# Patient Record
Sex: Female | Born: 1935 | Race: White | Hispanic: No | Marital: Married
Health system: Southern US, Community
[De-identification: ages and names within clinical notes are randomized; demographics above are authoritative.]

---

## 1997-07-26 ENCOUNTER — Other Ambulatory Visit: Admission: RE | Admit: 1997-07-26 | Discharge: 1997-07-26 | Payer: Self-pay | Admitting: Obstetrics and Gynecology

## 1999-01-08 ENCOUNTER — Other Ambulatory Visit: Admission: RE | Admit: 1999-01-08 | Discharge: 1999-01-08 | Payer: Self-pay | Admitting: Obstetrics and Gynecology

## 2000-03-04 ENCOUNTER — Other Ambulatory Visit: Admission: RE | Admit: 2000-03-04 | Discharge: 2000-03-04 | Payer: Self-pay | Admitting: Obstetrics and Gynecology

## 2000-09-18 ENCOUNTER — Encounter (INDEPENDENT_AMBULATORY_CARE_PROVIDER_SITE_OTHER): Payer: Self-pay | Admitting: *Deleted

## 2000-09-18 ENCOUNTER — Encounter: Payer: Self-pay | Admitting: Emergency Medicine

## 2000-09-19 ENCOUNTER — Encounter: Payer: Self-pay | Admitting: Cardiology

## 2000-09-19 ENCOUNTER — Inpatient Hospital Stay (HOSPITAL_COMMUNITY): Admission: EM | Admit: 2000-09-19 | Discharge: 2000-09-30 | Payer: Self-pay | Admitting: Emergency Medicine

## 2000-09-20 ENCOUNTER — Encounter: Payer: Self-pay | Admitting: Cardiology

## 2000-09-23 ENCOUNTER — Encounter: Payer: Self-pay | Admitting: Cardiology

## 2000-09-24 ENCOUNTER — Encounter: Payer: Self-pay | Admitting: Cardiology

## 2000-09-29 ENCOUNTER — Encounter: Payer: Self-pay | Admitting: Cardiology

## 2000-09-30 ENCOUNTER — Encounter: Payer: Self-pay | Admitting: Cardiology

## 2000-10-24 ENCOUNTER — Encounter: Payer: Self-pay | Admitting: Cardiothoracic Surgery

## 2000-10-24 ENCOUNTER — Encounter: Admission: RE | Admit: 2000-10-24 | Discharge: 2000-10-24 | Payer: Self-pay | Admitting: Cardiothoracic Surgery

## 2001-01-29 ENCOUNTER — Encounter: Payer: Self-pay | Admitting: Cardiothoracic Surgery

## 2001-01-29 ENCOUNTER — Encounter: Admission: RE | Admit: 2001-01-29 | Discharge: 2001-01-29 | Payer: Self-pay | Admitting: Cardiothoracic Surgery

## 2001-04-30 ENCOUNTER — Encounter: Payer: Self-pay | Admitting: Cardiothoracic Surgery

## 2001-04-30 ENCOUNTER — Encounter: Admission: RE | Admit: 2001-04-30 | Discharge: 2001-04-30 | Payer: Self-pay | Admitting: Cardiothoracic Surgery

## 2001-07-06 ENCOUNTER — Other Ambulatory Visit: Admission: RE | Admit: 2001-07-06 | Discharge: 2001-07-06 | Payer: Self-pay | Admitting: Obstetrics and Gynecology

## 2001-08-07 ENCOUNTER — Ambulatory Visit (HOSPITAL_COMMUNITY): Admission: RE | Admit: 2001-08-07 | Discharge: 2001-08-07 | Payer: Self-pay | Admitting: Internal Medicine

## 2001-08-20 ENCOUNTER — Encounter: Payer: Self-pay | Admitting: Internal Medicine

## 2001-08-20 ENCOUNTER — Encounter: Admission: RE | Admit: 2001-08-20 | Discharge: 2001-08-20 | Payer: Self-pay | Admitting: Internal Medicine

## 2001-10-20 ENCOUNTER — Inpatient Hospital Stay (HOSPITAL_COMMUNITY): Admission: EM | Admit: 2001-10-20 | Discharge: 2001-10-26 | Payer: Self-pay | Admitting: Emergency Medicine

## 2001-10-20 ENCOUNTER — Encounter: Payer: Self-pay | Admitting: Emergency Medicine

## 2001-10-21 ENCOUNTER — Encounter: Payer: Self-pay | Admitting: Internal Medicine

## 2001-10-21 ENCOUNTER — Encounter: Payer: Self-pay | Admitting: Cardiology

## 2001-10-23 ENCOUNTER — Encounter: Payer: Self-pay | Admitting: Internal Medicine

## 2001-12-03 ENCOUNTER — Ambulatory Visit (HOSPITAL_BASED_OUTPATIENT_CLINIC_OR_DEPARTMENT_OTHER): Admission: RE | Admit: 2001-12-03 | Discharge: 2001-12-03 | Payer: Self-pay | Admitting: Internal Medicine

## 2002-05-02 ENCOUNTER — Inpatient Hospital Stay (HOSPITAL_COMMUNITY): Admission: EM | Admit: 2002-05-02 | Discharge: 2002-05-07 | Payer: Self-pay | Admitting: Emergency Medicine

## 2002-05-02 ENCOUNTER — Encounter: Payer: Self-pay | Admitting: Emergency Medicine

## 2002-12-03 ENCOUNTER — Other Ambulatory Visit: Admission: RE | Admit: 2002-12-03 | Discharge: 2002-12-03 | Payer: Self-pay | Admitting: Internal Medicine

## 2004-09-18 ENCOUNTER — Emergency Department (HOSPITAL_COMMUNITY): Admission: EM | Admit: 2004-09-18 | Discharge: 2004-09-18 | Payer: Self-pay | Admitting: Emergency Medicine

## 2004-12-21 ENCOUNTER — Emergency Department (HOSPITAL_COMMUNITY): Admission: EM | Admit: 2004-12-21 | Discharge: 2004-12-21 | Payer: Self-pay | Admitting: Emergency Medicine

## 2005-11-29 ENCOUNTER — Inpatient Hospital Stay (HOSPITAL_COMMUNITY): Admission: EM | Admit: 2005-11-29 | Discharge: 2005-12-04 | Payer: Self-pay | Admitting: Emergency Medicine

## 2005-12-02 ENCOUNTER — Encounter: Payer: Self-pay | Admitting: Cardiology

## 2005-12-06 ENCOUNTER — Encounter (INDEPENDENT_AMBULATORY_CARE_PROVIDER_SITE_OTHER): Payer: Self-pay | Admitting: *Deleted

## 2005-12-06 ENCOUNTER — Inpatient Hospital Stay (HOSPITAL_COMMUNITY): Admission: EM | Admit: 2005-12-06 | Discharge: 2005-12-10 | Payer: Self-pay | Admitting: Emergency Medicine

## 2006-01-14 ENCOUNTER — Emergency Department (HOSPITAL_COMMUNITY): Admission: EM | Admit: 2006-01-14 | Discharge: 2006-01-15 | Payer: Self-pay | Admitting: Emergency Medicine

## 2006-08-14 ENCOUNTER — Emergency Department (HOSPITAL_COMMUNITY): Admission: EM | Admit: 2006-08-14 | Discharge: 2006-08-14 | Payer: Self-pay | Admitting: Emergency Medicine

## 2006-10-21 ENCOUNTER — Emergency Department (HOSPITAL_COMMUNITY): Admission: EM | Admit: 2006-10-21 | Discharge: 2006-10-21 | Payer: Self-pay | Admitting: *Deleted

## 2009-03-12 IMAGING — CT CT MAXILLOFACIAL W/O CM
3 of 6 series · 16 of 47 positions shown, 19 images · non-contrast
Comparison: none

HISTORY: Fall striking face

[Series 8: facial 2.0 h30s st · axial · 0.35mm/px · z∈[-287,-161]mm · 10 of 77 slices shown, 13 images]
[im 7/77  brain]
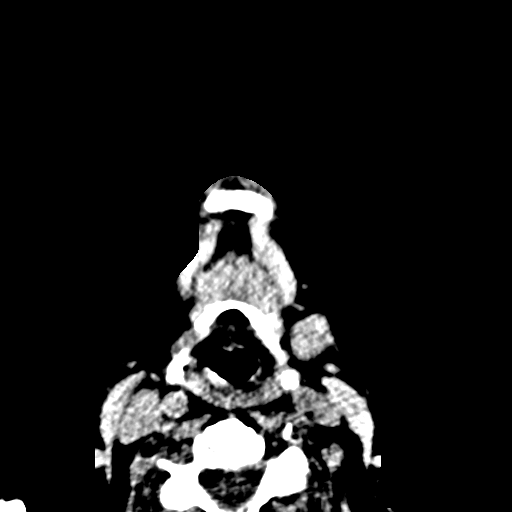
[im 7/77  bone]
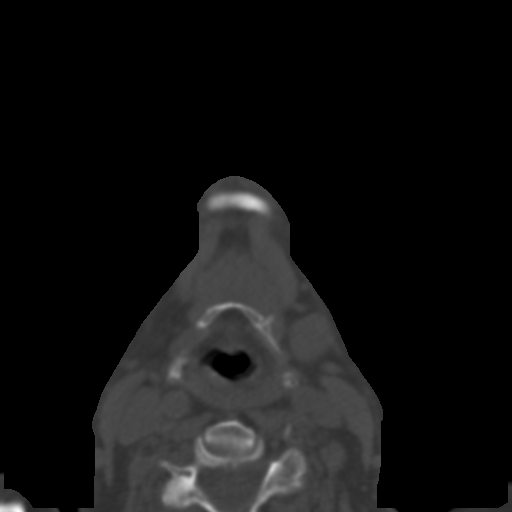
[im 13/77  bone]
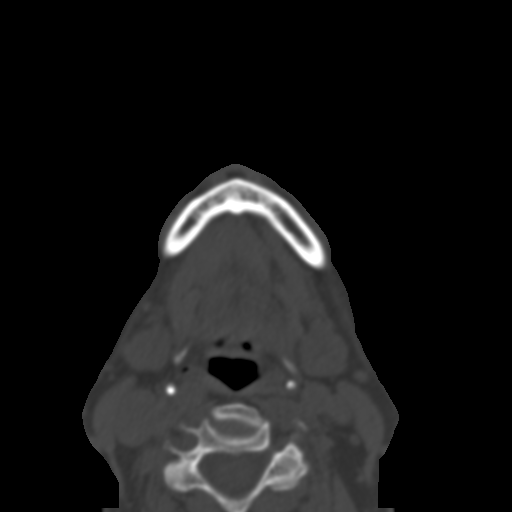
[im 20/77  bone]
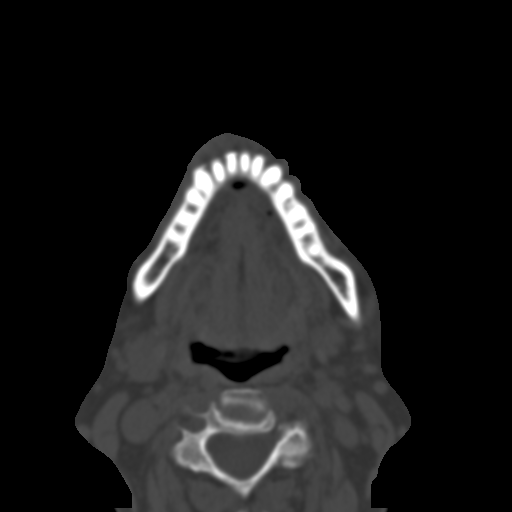
[im 26/77  bone]
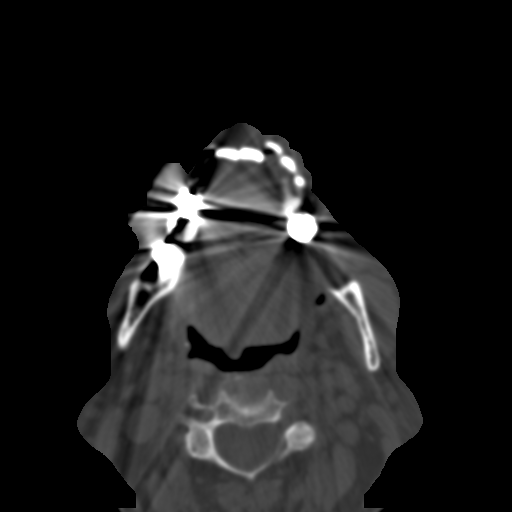
[im 32/77  brain]
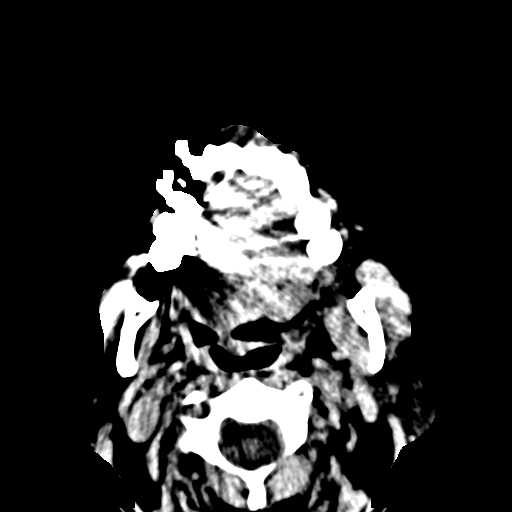
[im 32/77  bone]
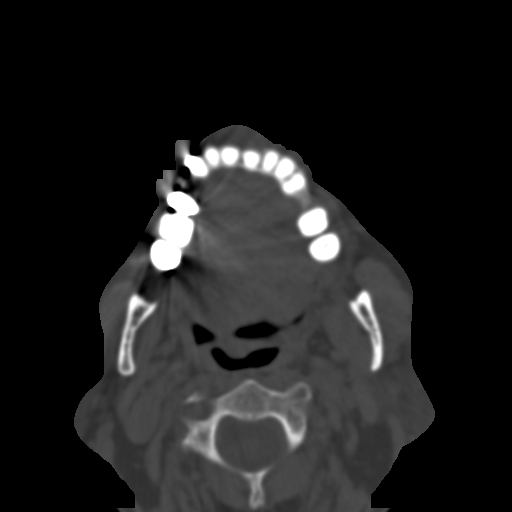
[im 45/77  bone]
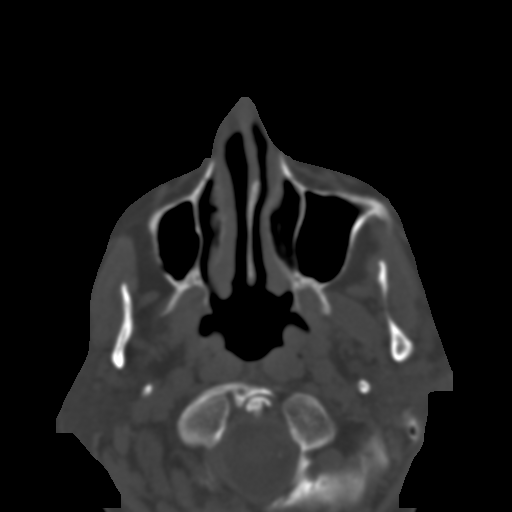
[im 51/77  bone]
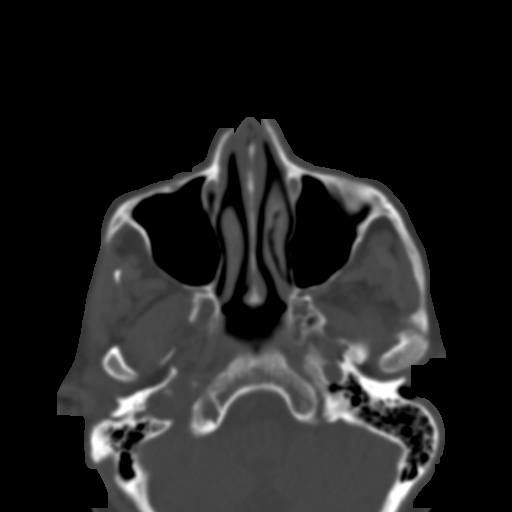
[im 58/77  bone]
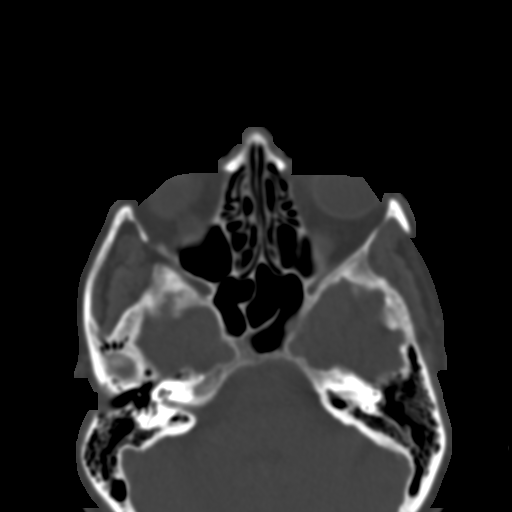
[im 64/77  brain]
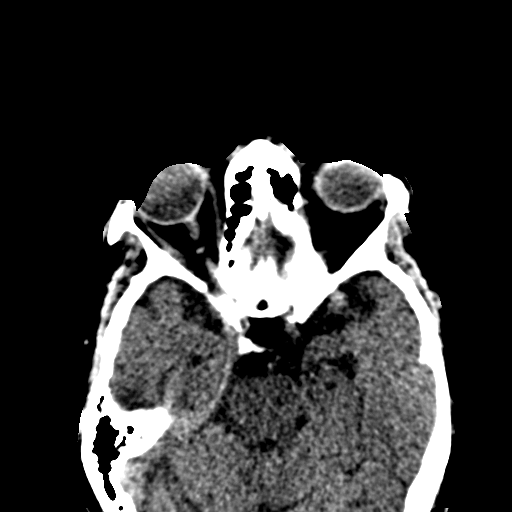
[im 64/77  bone]
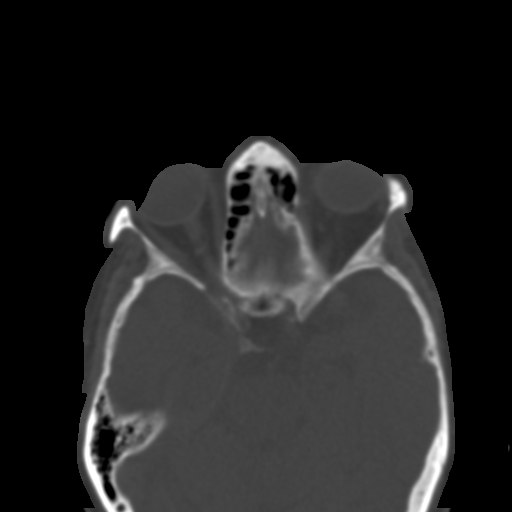
[im 70/77  bone]
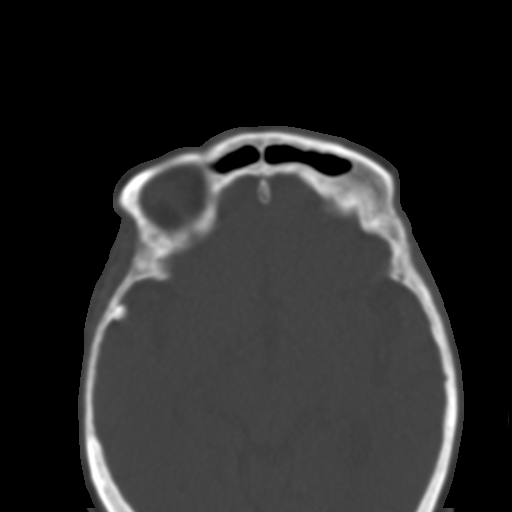

[Series 9: facial sagittal · sagittal · 0.39mm/px · 3 of 69 slices shown]
[im 23/69  bone]
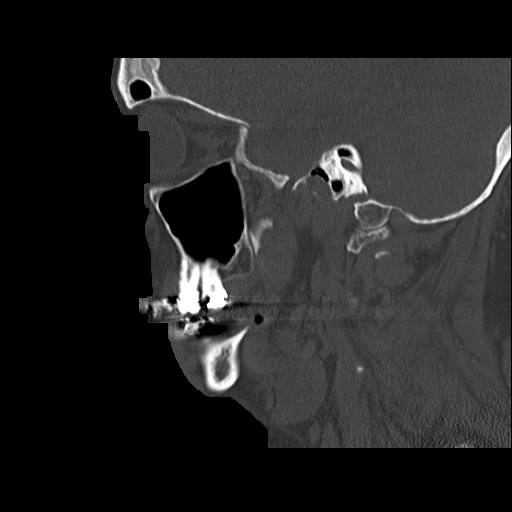
[im 35/69  bone]
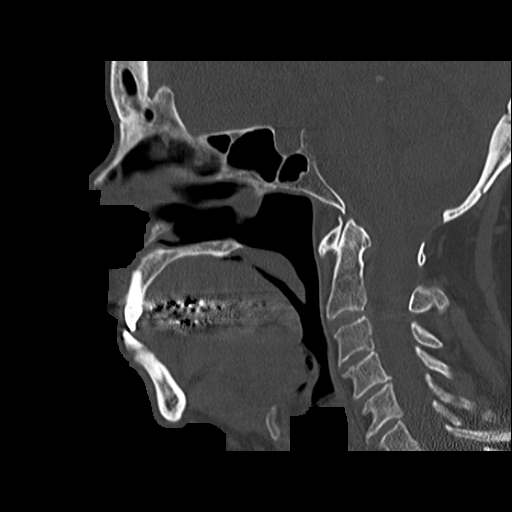
[im 46/69  bone]
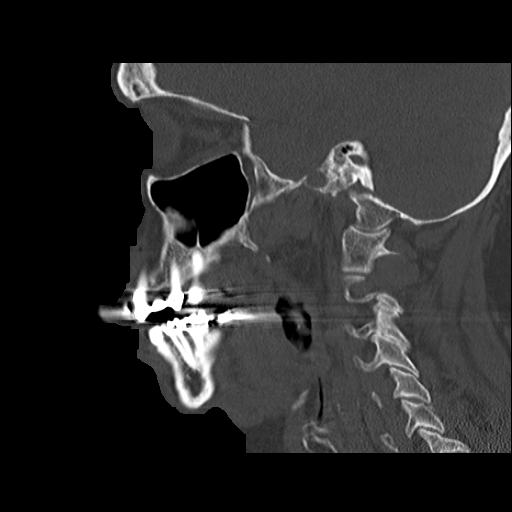

[Series 10: facial coronal · coronal · 0.39mm/px · 3 of 71 slices shown]
[im 24/71  bone]
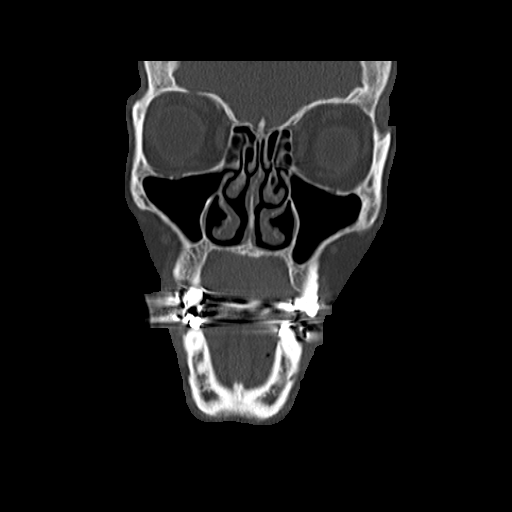
[im 32/71  bone]
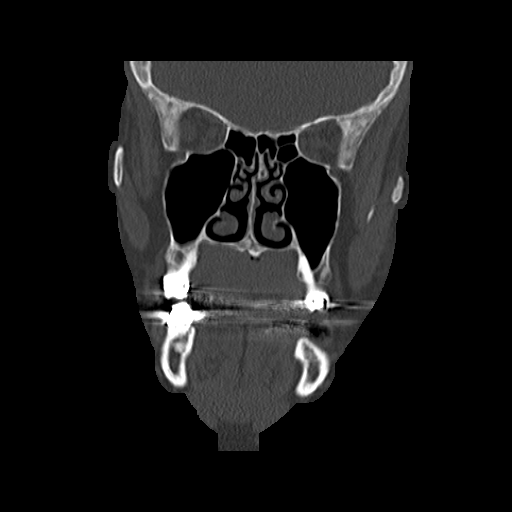
[im 39/71  bone]
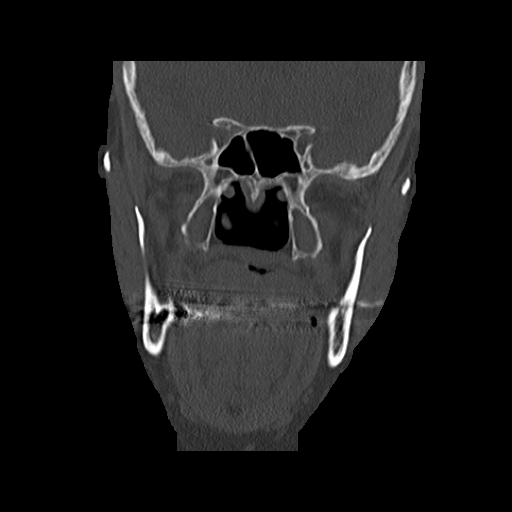

[16 of 47 positions shown; findings below may reference images not displayed]

CT HEAD WITHOUT CONTRAST:

Routine noncontrast CT head compared to 12/01/2005

Generalized atrophy.
Normal ventricular morphology.
No midline shift or mass-effect.
Small white matter infarct in right centrum semiovale.
No intracranial hemorrhage, acute infarct, or mass.
Scattered streak artifacts.
Question old left cerebellar hemisphere infarct.
Mild hyperostosis frontalis interna.
Skull intact.
Visualized sinuses clear.
IMPRESSION: Atrophy with suspect small old infarcts at left cerebellar hemisphere and right
centrum semiovale.
No acute intracranial abnormalities.

CT FACIAL BONES WITHOUT CONTRAST:

Multidetector noncontrast helical CT imaging facial bones performed.
Sagittal and coronal images reconstructed from axial data set.

Diffuse bony demineralization.
Soft tissue swelling at nose without definite nasal bone fracture.
Soft tissue swelling/contusion at premaxillary and pre mandibular soft tissues.
Paranasal sinuses clear.
Orbits intact.
Beam hardening artifacts from dental fillings.
Mastoid air cells and middle ear cavities appear clear.
Question tiny fracture at base of anterior maxillary spine, nondisplaced,
extending in an anteroposterior sagittal plane through anterior maxilla.
No other potential facial bone fractures identified.
IMPRESSION: Questionable nondisplaced fracture through anterior maxilla at base of anterior
maxillary spine.
No other facial bone abnormalities identified.

## 2009-03-12 IMAGING — CR DG KNEE COMPLETE 4+V*L*
4 series · 4 of 4 positions shown · non-contrast
Comparison: none

HISTORY: Fall, bilateral knee swelling

LEFT KNEE 4 VIEWS:
Diffuse bony demineralization.
Degenerative changes particularly medial compartment with joint space narrowing
and marginal spur formation.
Mild anterior soft tissue swelling.
No fracture, dislocation, or bone destruction.
No definite knee joint effusion.

[t knee ap left]
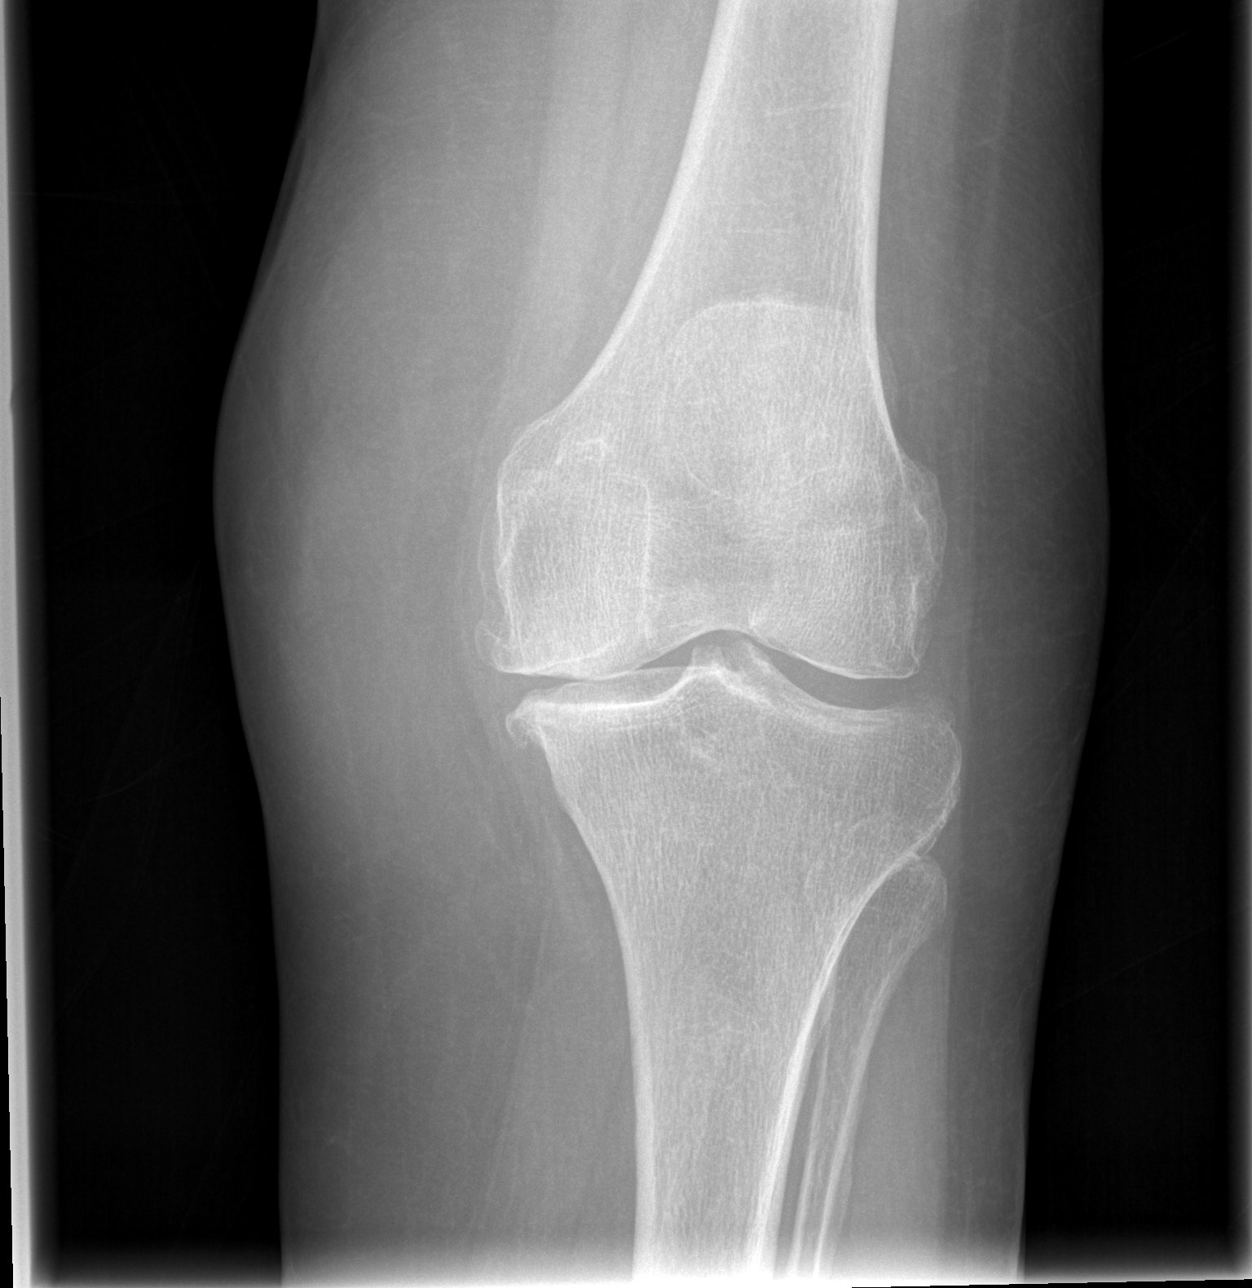

[t knee oblique left (1 of 2)]
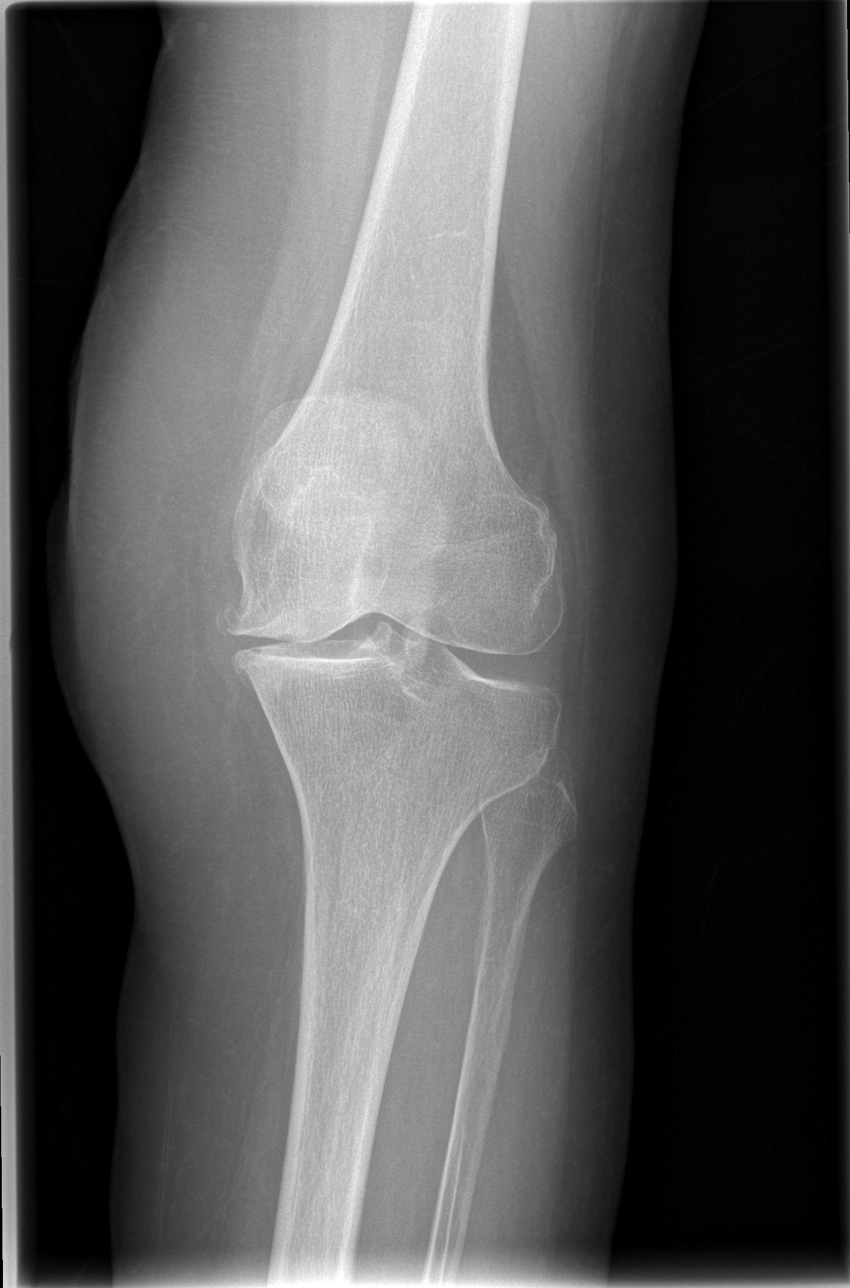

[t knee oblique left (2 of 2)]
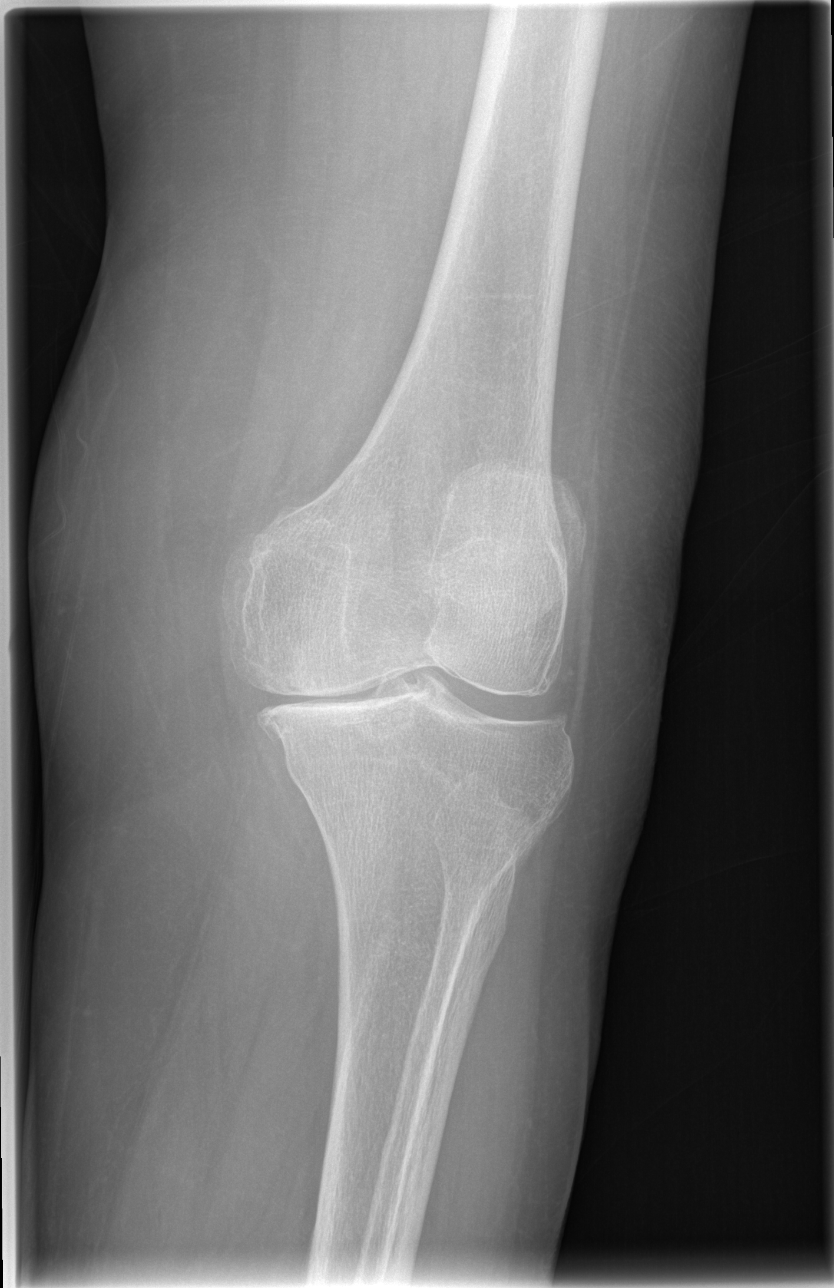

[t knee lat left]
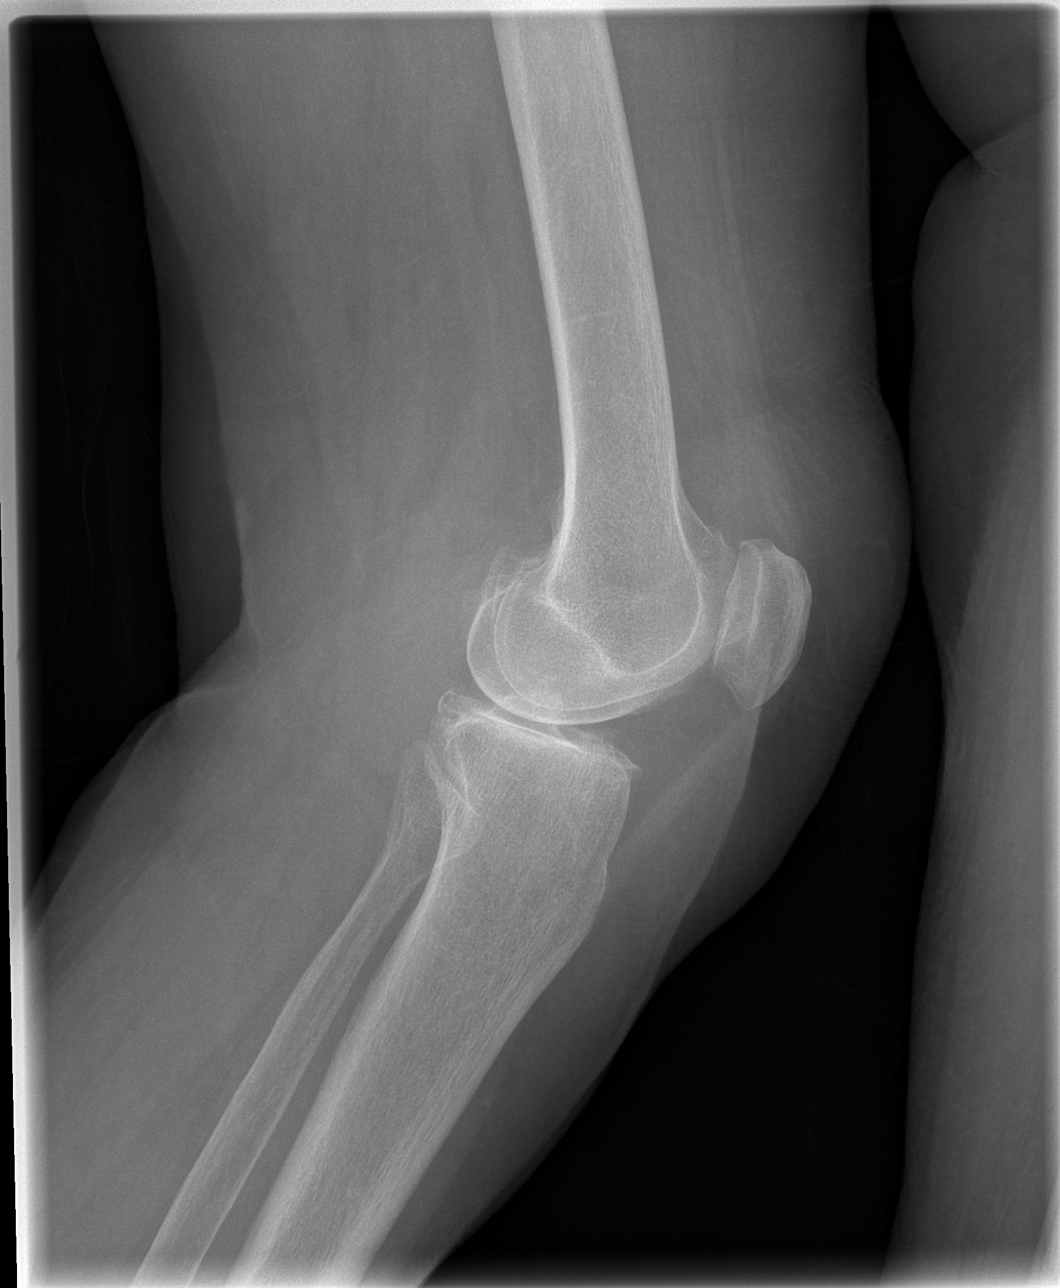

[4 of 4 positions shown; findings below may reference images not displayed]

IMPRESSION: Osteoarthritic changes medial compartment left knee.
Osteoporosis.
No acute bony abnormalities.

RIGHT KNEE 4 VIEWS:

Osteoarthritic changes medial compartment with joint space narrowing and
marginal spur formation.
Diffuse osteoporosis.
Patellofemoral degenerative changes.
No fracture, dislocation, or bone destruction.
No knee joint effusion.
IMPRESSION: Osteoarthritic changes and osteoporosis.
No acute bony abnormalities.

## 2010-08-10 NOTE — Discharge Summary (Signed)
NAMEJAYLNN, Abigail Sanders                 ACCOUNT NO.:  [id number]   MEDICAL RECORD NO.:  [id number]          PATIENT TYPE:  INP   LOCATION:  2019                         FACILITY:  [facility]   PHYSICIAN:  Larina Earthly, M.D.    DATE OF BIRTH:  1922-05-18   DATE OF ADMISSION:  12/05/2005  DATE OF DISCHARGE:                                 DISCHARGE SUMMARY   ADMISSION DIAGNOSIS:  Left arm ischemia with probable left brachial embolus.   DISCHARGE DIAGNOSES:  1. Left arm ischemia with left brachial embolus, status post left brachial      embolectomy.  2. Status post mitral valve repair several years ago.  3. Chronic atrial fibrillation on Coumadin.  4. Dementia.  5. Lives in a nursing facility.  6. Hypertension.  7. Hyperlipidemia.  8. Depression.  9. Coronary artery disease.  10.History of congestive heart failure.  11.Prerenal azotemia.  12.History of mitral valve prolapse status post replacement.   CONSULTS:  None.   PROCEDURES:  On December 05, 2005, the patient underwent a left brachial  embolectomy by Dr. Gretta Began.   HISTORY AND PHYSICAL:  This is an 77 year old black female with a history of  mitral valve repair, placement, congestive heart failure, and atrial  fibrillation on chronic Coumadin.  The patient presented to the emergency  room at approximately 11 p.m. on March 06, 2006 with a cold, painful,  numb left hand.  The patient had a normal radial pulse and ulnar pulse in  the right arm.  It is presumed that the patient had a left brachial embolus.  The patient was taken to the operating room for embolectomy by Dr. Gretta Began.  The patient's Coumadin was supratherapeutic when she was admitted to  the hospital.  The patient lives at [facility].   Postoperatively, the patient underwent a left brachial embolectomy without  any complications.  The patient's Coumadin was restarted on December 06, 2005 per pharmacy.  Cardiology was also seeing the patient  for a history of  congestive heart failure.  The patient's Coumadin target INR is 3.0.  They  have resumed her Lopressor, Vasotec, Imdur and aspirin.   Postoperatively, the patient has progressed well.  The patient remained in  the hospital due to her waiting for her Coumadin to be therapeutic.  The  patient's Coumadin level was therapeutic on December 09, 2005.   The patient did develop a soft left arm hematoma.  She had 2+ radial and  ulnar pulses that were palpable.  The hematoma should resolve with time.  The patient has remained hemodynamically stable.  She did not require any  blood transfusions.  The patient is ambulating without difficulty.   PHYSICAL EXAMINATION:  VITAL SIGNS:  Blood pressure 125/66, pulse 74, O2  saturations 95% on room air.  The patient's telemetry is atrial  fibrillation.  HEART:  Irregular rate and rhythm.  The patient has a mitral valve click.  LUNGS:  Clear to auscultation.  EXTREMITIES:  No extremity edema.   LABORATORY DATA:  Sodium 141, potassium 4.2, chloride 109, CO2 of  66, BUN  18, creatinine 1.1, and glucose of 100.  The patient's INR is 3.3.  The  patient will be discharged back to the nursing home in the a.m., provided  her vitals remain stable.  She is hemodynamically stable, and her INR  remains therapeutic.   DISCHARGE DISPOSITION:  The patient will be discharged home to [facility].   MEDICATIONS:  1. Aricept 10 mg p.o. daily.  2. Namenda 10 mg p.o.  3. Zyprexa 5 mg p.o.  4. Vasotec 10 mg p.o. daily.  5. Paxil 10 mg p.o. daily.  6. Potassium chloride 20 mEq p.o. daily.  7. Protonix 40 mg p.o. daily.  8. Imdur 30 mg p.o. daily.  9. Aspirin 325 mg p.o. daily.  10.Lopressor 50 mg p.o. b.i.d.  11.Claritin 10 mg p.o. daily.  12.Ultram 50 mg 1-2 tablets every 4-6 hours p.r.n.  13.Coumadin 2.5 mg tablets, start with one-half tablet, 1.25 mg daily, and      recheck the INR in 2 days and titrate or decrease as  appropriate.   INSTRUCTIONS:  The patient is instructed to follow a low-fat, low-salt diet.  No driving or heavy lifting greater than 10 pounds for 2 weeks.  The patient  is to ambulate 3-4 times daily and increase activity as tolerated.  She may  shower and clean her incision with mild soap and water.  The patient is to  call the office with any problems such as incision drainage, erythema,  temperature greater than 101.5.  The nursing home is to regulate the  patient's Coumadin.  Her INR should be checked in the next 1-2 days.   FOLLOWUP:  The patient will see Dr. Arbie Cookey in 3 weeks.  Will contact her with  the time and date of appointment.      Constance Holster, PA      Larina Earthly, M.D.  Electronically Signed    JMW/MEDQ  D:  12/09/2005  T:  12/09/2005  Job:  [id number]   cc:   Larina Earthly, M.D.

## 2010-08-10 NOTE — Consult Note (Signed)
[place]  Patient:    Abigail Sanders, Abigail Sanders                        MRN: [id number] Proc. Date: 09/19/00 Adm. Date:  [id number] Attending:  [place], Peter Manning CC:         Peter M. [place], M.D.  Luanna Cole. Lenord Fellers, M.D.   Consultation Report  REASON FOR CONSULTATION:  Abnormal CT.  HISTORY:  The patient is a very nice 77 year old woman who is followed for chronic atrial fibrillation and mitral valve replacement by Dr. [place].  She has had pain in her chest that is substernal, some shortness of breath.  The pain is not clearly positional.  She has been given Vicodin and Celebrex for presumed costochondritis, but the pain recurs fairly frequently when taking Vicodin.  She also developed swelling of her left leg.  She presented with these symptoms to the emergency room and was admitted.  The CT of the chest showed no evidence of a pulmonary embolus, but did show a large mass of lymph nodes and soft tissue associated with the distal esophagus.  The question was whether or not this could be an esophageal cancer.  We were asked to see her for further evaluation of this.  The patient notes that she does have occasional problems swallowing.  This has been going on for a long time an only if she eats too fast.  She does not consider this a problem and feels that she could sit down and eat a steak dinner without difficulty as long as she chewed the steak and swallowed it well.  This has not changes recently.  She also has no problems swallowing liquids, has no odynophagia, no hang up regurgitation, etc.  She denies ever having any real heartburn, although she has taken Tums on one or two occasions.  This has not been a regular thing for her.  MEDICATIONS ON ADMISSION:  Coumadin, Cardizem, Lanoxin, Vaseretic, potassium.  ALLERGIES:  No known drug allergies.  PAST MEDICAL HISTORY: 1. History of mitral valve replacement in 1994 with subsequent chronic atrial  fibrillation. 2. Osteoporosis. 3. Hypertension. 4. History of skin cancer removal.  FAMILY HISTORY:  Remarkable for prostate cancer, heart disease, ALS.  No family history of any kind of GI cancer.  SOCIAL HISTORY:  The patient is retired.  She lives with her husband, who tends to be quite ill.  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  She does not smoke or drink.  She has two children.  PHYSICAL EXAMINATION:  VITAL SIGNS:  Afebrile with normal vital signs.  GENERAL:  Pleasant white female in no acute distress.  HEENT:  Eyes clear and anicteric.  NECK:  Supple.  I cannot really appreciate any lymphadenopathy or masses.  LUNGS:  Clear.  HEART:  Regular rate and rhythm.  No murmurs or gallops.  ABDOMEN:  Soft and nontender.  ASSESSMENT: 1. Abnormal CT.  This could be an esophageal mass intrinsic to the esophagus    or it could be extrinsic from lymphoma, etc.  It does appear that it is    emanating from the esophagus, but this was an uncontrasted study.  She has    no real dysphagia, which would be a bit unusual for a mass of this size. 2. Sternal pain does appear to be costochondritis by physical. 3. Recent phlebitis. 4. Atrial fibrillation status post mitral valve replacement on chronic    anticoagulation.  PLAN: 1.  I agree to go ahead an hold her Coumadin so that we can do a biopsy next    week and will not have to worry about her being anticoagulated. 2. Will go ahead and obtain a barium swallow just to try to determine more    definitively whether or not the mass is intrinsic or extrinsic to the    esophagus. DD:  09/19/00 TD:  09/20/00 Job: [id number]

## 2010-08-10 NOTE — Discharge Summary (Signed)
Abigail Sanders, Abigail Sanders                 ACCOUNT NO.:  [id number]   MEDICAL RECORD NO.:  [id number]          PATIENT TYPE:  INP   LOCATION:  4743                         FACILITY:  [facility]   PHYSICIAN:  Madaline Savage, MD        DATE OF BIRTH:  1922-09-27   DATE OF ADMISSION:  11/29/2005  DATE OF DISCHARGE:                                 DISCHARGE SUMMARY   DISCHARGE DIAGNOSES:  1. Congestive heart failure cardiac failure.  2. Valvular heart disease with a history of replacement and mitral valve      prolapse.  3. Prerenal azotemia.  4. Atrial fibrillation, chronic.   DISCHARGE MEDICATIONS:  1. Aricept 10 mg daily.  2. Namenda 10 mg daily.  3. Zyprexa 5 mg daily.  4. Vasotec 10 mg daily.  5. Paxil 10 mg daily.  6. Potassium chloride 20 mEq daily.  7. Protonix 40 mg daily.  8. Imdur 30 mg daily.  9. Aspirin 325 mg daily.  10.Lopressor 50 mg twice daily.  11.Coumadin 5 mg daily and as per Dr. [place] on discharge.  12.Claritin 10 mg daily.   SHORT HOSPITAL COURSE:  [name] is an 77 year old lady who was admitted by  the emergency department with complaints of worsening shortness of breath.  She states her breathing was getting worse over the last 28 hours and was  unable to walk because of shortness of breath.  On evaluation, her x-ray  showed pulmonary hypertension and a CT of the chest was done which did not  show any pulmonary embolism.  She was admitted with mild diuresis and  cardiology was consulted because of an elevated troponin.  She was seen by  Dr. Verdis Prime from cardiology whose impression was this elevated troponin  was of uncertain significance.  We did a 2-D echocardiogram to rule out  mitral valve dysfunction and serial cardiac markers were obtained.  Since  she was stable and her INR has now been therapeutic, she will be discharged  home in stable condition.  She will get conservative medical management for  her possible ischemia and she will be following with Dr.  [place], the  cardiologist, as an outpatient.  She will be discharged back to the assisted  living in stable condition and she will follow up with Dr. [place] in about 2-  3 weeks.      Madaline Savage, MD  Electronically Signed     PKN/MEDQ  D:  12/04/2005  T:  12/04/2005  Job:  [id number]

## 2010-08-10 NOTE — H&P (Signed)
Abigail Sanders, Abigail Sanders                 ACCOUNT NO.:  [id number]   MEDICAL RECORD NO.:  [id number]          PATIENT TYPE:  INP   LOCATION:  4743                         FACILITY:  [facility]   PHYSICIAN:  Della Goo, M.D. DATE OF BIRTH:  02-12-1923   DATE OF ADMISSION:  11/29/2005  DATE OF DISCHARGE:                                HISTORY & PHYSICAL   CHIEF COMPLAINT:  Worsening shortness of breath.   HISTORY OF PRESENT ILLNESS:  This is an 77 year old female who was brought  to the emergency department with her daughter, secondary to complaints of  worsening shortness of breath. The patient reports being short of breath for  approximately 2 months. However, reports worsening of it over the past 24  hours, such that she was unable to ambulate due to dyspnea on exertion. The  patient denies having chest pain or diaphoresis associated with her  symptoms. She had been placed on nasal cannula oxygen therapy of 2 liters  with some improvement, along with being placed on a very, very low dose of  diuretic treatment.   PAST MEDICAL HISTORY:  1. Mitral valve prolapse, status post mitral valve replacement.  2. Hypertension.  3. Atrial flutter.  4. Dementia.   CONSULTATIONS:  Corky Crafts, M.D., Cardiology.   MEDICATIONS:  1. Coumadin 2.5 mg on Monday, Tuesday, Friday, and Saturday.  2. Aricept 10 mg 1 p.o. daily.  3. Zyprexa 5 mg 1 p.o. q.h.s.  4. Namenda 10 mg 1 p.o. daily.  5. Lisinopril 10 mg 1 p.o. daily.  6. Diltiazem 240 mg 1 p.o. daily  7. Actonel 30 mg 1 p.o. each week, on Thursday.  8. KCl 20 meq 1 p.o. each morning.  9. Paxil 10 mg 1 p.o. each evening.  10.Enalapril 10/25 hydrochlorothiazide 1 p.o. each morning.  11.Jantovan 25 mg 1 p.o. each Sunday, Tuesday, Wednesday, Thursday, and      Saturday.  12.Digoxin 0.125 mg 1 p.o. daily.  13.Diltiazem 240 mg 1 p.o. daily.  14.Namenda 10 mg 1 p.o. daily.   ALLERGIES:  NO KNOWN DRUG ALLERGIES.   SOCIAL HISTORY:  The  patient lives in an assisted living complex. No history  of alcohol or drug usage. Non-smoker.   FAMILY HISTORY:  Noncontributory.   REVIEW OF SYSTEMS:  Pertinent's mentioned above.   PHYSICAL EXAMINATION:  GENERAL:  A pleasantly confused 77 year old female,  well developed, well nourished, no acute distress.  VITAL SIGNS:  Temperature 98.5, blood pressure 142/84, heart rate 70,  respiratory rate 16. O2 saturation 98%.  HEENT:  Normocephalic and atraumatic. Pupils are equal, round, and reactive  to light. Extraocular muscles intact. Oropharynx clear.  NECK:  Supple. No jugular venous distention. NO carotid bruit.  LUNGS:  Clear to auscultation bilaterally.  ABDOMEN:  Positive bowel sounds. Soft, nontender, and nondistended. No  hepatosplenomegaly.  EXTREMITIES:  With 1+ edema.  NEUROLOGIC:  Alert and oriented times two. Motor and sensory function  intact.   LABORATORY DATA:  White blood cell count 7.4. Hemoglobin and hematocrit 13.2  and 39.3. Platelets 192,000. MCV 92.7. Neutrophils 70%. Lymphocytes 19%.  Initial  cardiac profile revealing a myoglobin of 85.3. CK MB of 1.5 and  troponin I less than 0.05. Prothrombin time 17.0 seconds with an INR of 1.3.  Digoxin level less than 0.2. Beta natriuretic peptide 148. Urinalysis  negative except for trace leukocyte esterase.   A chest x-ray was performed revealing pulmonary hypertension, positive COPD  changes. CT of the chest revealed no acute pulmonary embolism, small focal  out-pouching of the ascending aorta, which may be due to an ulcerative  plaque formation.   ASSESSMENT:  An 77 year old female with increased shortness of breath, found  to be in mild acute congestive heart failure. She was placed on nasal  cannula oxygen and started on diuretic therapy and had improvement.   PROBLEM LIST:  1. MILD ACUTE CONGESTIVE HEART FAILURE SYNDROME.  2. VALVULAR DISEASE WITH HISTORY OF VALVE REPLACEMENT AND MITRAL VALVE      PROLAPSE.   3. HYPERTENSION.  4. COAGULOPATHY SECONDARY TO COUMADIN THERAPY, CURRENTLY SUB-THERAPEUTIC      LEVEL.  5. ATRIAL FLUTTER.   PLAN:  The patient has been admitted to the telemetry area for further  cardiac monitoring. She will be gently diuresed. Cardiology has been  consulted secondary to positive troponin in the first cardiac panel at a  level of 1.4. The patient will continue on her regular medications and will  have further adjustment in her Coumadin dosage, until she is therapeutic.  The patient has also been placed on GI prophylactic therapy.      Della Goo, M.D.  Electronically Signed     HJ/MEDQ  D:  11/30/2005  T:  11/30/2005  Job:  [id number]   cc:   Corky Crafts, MD

## 2010-08-10 NOTE — Procedures (Signed)
[place]  Patient:    Abigail Sanders, NAIRN                        MRN: [id number] Proc. Date: 09/22/00 Adm. Date:  [id number] Attending:  [place], Peter Manning CC:         Peter M. [place], M.D.  Luanna Cole. Lenord Fellers, M.D.   Procedure Report  PROCEDURE:  Esophagogastroduodenoscopy.  PREMEDICATION:  Hurricane spray, fentanyl 30 mg, and Versed 3 mg IV.  Cipro 400 mg IV.  INDICATION:  Abnormal CT suggesting a mass in the distal esophagus.  A barium swallow showing a stricture but no gross evidence of a tumor.  This procedure is done to evaluate further.  INFORMED CONSENT:  The procedure had been explained to the patient and consent obtained.  DESCRIPTION OF PROCEDURE:  The patient in the left lateral decubitus position, the Olympus video endoscope was inserted blindly in the esophagus and advanced under direct visualization.  The stomach was entered.  The pylorus was identified and passed.  The duodenum including the bulb and second portion were seen well and were unremarkable.  The scope was withdrawn back in the stomach.  The antrum was normal.  The body of the stomach was normal as well.  Fundus and cardia were seen well and were normal.  There was a very mild stricture at the GE junction.  The scope easily passed with no resistance.  The distal esophagus was completely endoscopically normal with no evidence of tumor, mass, inflammation, etc.  The scope was withdrawn.  The proximal esophagus was normal as well.  There was no obvious evidence of extrinsic compression.  The patient tolerated the procedure well.  Maintained on low flow oxygen and pulse oximeter throughout the procedure with no obvious problem.  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  PLAN:  We will resume diet, low residue mechanically soft.  We will obtain a CT scan of the abdomen and pelvis to rule out a lipoma of the  abdomen. DD:  09/22/00 TD:  09/22/00 Job: [id number]

## 2010-08-10 NOTE — Discharge Summary (Signed)
[place]  Patient:    Abigail Sanders, Abigail Sanders                        MRN: [id number] Adm. Date:  [id number] Disc. Date: [id number] Attending:  [place], Peter Manning CC:         Llana Aliment. Randa Evens, M.D.  Gwenith Daily Tyrone Sage, M.D.   Discharge Summary  HISTORY OF PRESENT ILLNESS:  Ms. Abigail Sanders is a 77 year old white female who has a history of rheumatic heart disease.  She is status post mechanical mitral valve replacement with a #27 St. Jude mitral valve in October of 1994 for severe mitral stenosis.  The patient has history of chronic atrial fibrillation, hypertension, and has been on chronic Coumadin therapy.  By prior cardiac catheterization in 1994, she had normal coronaries.  The patient presented on this occasion with refractory midsternal chest pain.  It was localized to the mid sternal area without radiation.  She had also noted some increased shortness of breath.  She has had no recent fever, cough, nausea or vomiting.  She has had no dysphagia.  The pain was not positional.  The patient had been seen in the office the day prior and was started on Celebrex and Vicodin for costochondritis. The Vicodin relieved her pain but it would come back in three and a half to four hours and it became more severe.  She also subsequently developed pain, redness and swelling of her left leg.  For details of her past medical history, social history, family history and physical examination, please see admission history and physical.  LABORATORY DATA:  Portable chest x-ray on admission showed no active disease.  Her ECG showed atrial fibrillation with controlled ventricular response.  The patient has Q-waves inferior laterally that were chronic. There were nonspecific STT wave changes.  White count was 11,800, hemoglobin 13.9, hematocrit 40.3, platelets 151,000. The patient did have a left shift on her differential.  Sed rate was 82. Protime was 21.7 with an INR of 2.5,  PTT was 69.  D-dimer was abnormal at 1.13.  Sodium was 134, potassium 3.3, chloride 94, CO2 29, BUN 29, creatinine 1.3, glucose 126, albumin 2.7, total bilirubin 1.8, all other chemistries were normal.  Magnesium level was 2.0.  CK was 214 with negative MB, troponin was 0.05.  Digoxin level 0.4.  HOSPITAL COURSE:  On physical examination the patients chest pain was reproduced by pain on palpation of the sternum.  She was continued on Vicodin and Celebrex for this discomfort.  In fact, her pain resolved over the next 24 to 48 hours.  She did have active swelling, redness, and pain of her left leg below the knee.  This was treated with leg elevation, local heat and she was started on amoxicillin 250 mg t.i.d. for superficial cellulitis.  A chest CT was obtained to rule out pulmonary embolus.  This was negative for pulmonary embolus, but demonstrated significant adenopathy and a mass of lymph nodes surrounding the esophagus posteriorly in the chest. There was no other significant adenopathy or masses of the chest. There was some suspicion this may have been a localized area to the esophagus, possible esophageal cancer. Lower extremity venous Doppler was performed and showed no evidence of DVT. In view of her CT findings, a GI consult was obtained with Fayrene Fearing L. Randa Evens, M.D.  Her Coumadin was held with the idea that the patient would require biopsy possibly of the esophagus or her enlarged  lymph nodes for further diagnostic evaluation.  She underwent a barium swallow which showed a small stricture in the distal esophagus but no masses.  She had a small hiatal hernia.  With correction of her INR, the patient did undergo upper endoscopy on September 22, 2000.  This showed a small benign stricture of the distal esophagus, otherwise normal esophagus and normal stomach.  The etiology of her marked adenopathy in the chest remained unknown. Differential included lymphoma versus cancer versus inflammatory  process.  We performed abdominal and pelvic CT to see if she had any further evidence of adenopathy in the abdomen.  However, there was no evidence of lymphadenopathy in the abdomen or pelvis.  She did have significant cholelithiasis with no active evidence of cholecystitis.  She had a 3 cm simple cyst in the upper pole of her left kidney and aortic atherosclerosis without aneurysm.  Her uterine cervix was prominent.  There was noted a small amount of fluid in the endometrial canal. Without further diagnostic possibilities, the patient was seen in consultation by Ramon Dredge B. Tyrone Sage, M.D.  It was recommended after significant discussion with the family and the patient that she undergo mediastinoscopy.  It was felt that the lymph nodes noted on CT scan were posterior but it was hoped with mediastinoscopy that we would get an adequate tissue sample for diagnosis. Otherwise she would require a thoracotomy procedure to reach these lymph nodes.  On September 29, 2000, the patient did undergo mediastinoscopy.  Mediastinal adenopathy was noted and carinal and right peritracheal nodes were biopsied. Frozen section showed inflammatory process without evidence of tumor or lymphoma.  Final pathology is pending at the time of discharge.  At this point it is felt that the patient was stable for discharge to home on continued antibiotic therapy.  She was discharged on bridging Lovenox until her Coumadin was therapeutic.  Also during her hospital stay, her left leg cellulitis improved dramatically. The redness and swelling resolved, as did her pain.  Her potassium was repleted to normal.  Her most recent protime on September 29, 2000, showed a PT of 16.3 with an INR of 1.5.  Her PTT was still elevated at 41 and the etiology of this coagulopathy is still unclear at this time.  The patient also experienced some episodes of bradycardia resulting in holding her Cardizem.  Her blood pressure remained stable throughout her  hospital stay and she remained  afebrile.  Last CBC on September 28, 2000, showed a white count of 5100, hemoglobin 12.1, hematocrit 35, platelets 319,000.  Most recent chemistries on September 28, 2000, also showed a sodium 136, potassium 3.6, chloride 105, CO2 26, glucose 96, BUN 16, and creatinine 0.8.  The patient was discharged home in stable condition on September 30, 2000.  DISCHARGE DIAGNOSES: 1. Mediastinal lymphadenopathy.  Additional biopsy demonstrates nonspecific    inflammatory process.  The patient is clinically stable. 2. Left lower extremity cellulitis, resolved. 3. Costochondritis, resolved. 4. Elevated PTT, etiology unclear, improved. 5. Status post mechanical mitral valve replacement for rheumatic heart    disease. 6. Chronic atrial fibrillation. 7. Chronic anticoagulation. 8. Small hiatal hernia, small stricture. 9. Prominence of cervix noted on CT scan, will need outpatient follow-up    pelvic exam.  DISCHARGE MEDICATIONS: 1. Coumadin 5 mg Tuesday and Wednesday, then 2.5 mg daily. 2. Lovenox 60 mg subcu q.12h. 3. Lanoxin 0.125 mg daily. 4. Vaseretic 10/25 mg daily. 5. Potassium 20 mEq x 2 doses daily. 6. Celebrex 200 mg daily for  one more week. 7. Amoxicillin 250 mg three times a day for three more days. 8. Cardizem will be held for now.  ACTIVITY:  The patient is to resume activity as tolerated.  DIET:  She will follow a low salt diet.  FOLLOW-UP:  She will return to our office on Friday, October 03, 2000, for a protime and have follow-up office visit with Peter M. [place], M.D., in seven to 10 days.  Recommend GYN evaluation as an outpatient. The patient to have follow-up office visit with Dr. Tyrone Sage on Thursday, October 16, 2000.  If the patient remains stable, will have follow-up chest CT in two to three months. DD:  09/30/00 TD:  09/30/00 Job: [id number]

## 2010-08-10 NOTE — Op Note (Signed)
NAME:  Abigail Sanders, WEED NO.:  [id number]   MEDICAL RECORD NO.:  [id number]          PATIENT TYPE:  INP   LOCATION:  3313                         FACILITY:  [facility]   PHYSICIAN:  Larina Earthly, M.D.    DATE OF BIRTH:  [birth year]   DATE OF PROCEDURE:  DATE OF DISCHARGE:                                 OPERATIVE REPORT   PREOPERATIVE DIAGNOSES:  Left arm ischemia with probable left brachial  embolus.   POSTOPERATIVE DIAGNOSES:  Left arm ischemia with probable left brachial  embolus.   PROCEDURE:  Left brachial embolectomy.   SURGEON:  Dr. Tawanna Cooler Early.   ASSISTANT:  Nurse.   ANESTHESIA:  MAC.   COMPLICATIONS:  None.   DISPOSITION:  To recovery room stable.   INDICATIONS FOR PROCEDURE:  The patient is an 77 year old white female with  a history of female mitral valve repair, replacement, and also congestive  heart failure and atrial fibrillation.  She presented to the emergency room  approximately 11 p.m. on December 05, 2005, with cold, painful, numb, left  hand.  She had normal radial pulse and ulnar pulse in the right arm.  It was  presumed that she had a left brachial embolus.  She was taken to the  operating room for embolectomy.   PROCEDURE IN DETAIL:  The patient was taken to the operating room.  Place  supine on the table.  The left arm and left axilla were prepped and draped  in the usual sterile fashion.  Incision was made using local anesthesia over  the level of the brachial artery just above the elbow.  There was some  hematoma in this area, presumably from antecubital venipuncture through a  recent admission to the hospital.  There was no significant amount of blood  around this area.  The brachial artery at this position was of normal  caliber and with minimal atherosclerotic change.  The patient was given 5000  units of intravenous heparin in the emergency department on initial  presentation when I saw her in the emergency department.  The  brachial  artery was opened transversely with an 11 blade.  A Fogarty catheter was  passed centrally.  There was minimal fore bleeding from the proximal artery.  The 4-0 Fogarty catheter was passed into the level of the chest and a large  amount of clot was removed.  Excellent inflow was encountered following  removal of the clot.  A 3-0 Fogarty catheter was positioned down past the  level of the antecubital space, would not pass down to the hand.  There was  excellent back bleeding from the distal arteries.  The incision in the  brachial artery was closed with interrupted 6-0 Prolene sutures.  Clamps  removed and excellent pulse was noted at  the level of the wrist and the radial and ulnar arteries.  The wounds were  irrigated with saline.  Hemostasis with electrocautery.  Wounds were closed  with 3-0 Vicryl in the subcutaneous and subcuticular tissue and then Steri-  Strips were applied.      Larina Earthly,  M.D.  Electronically Signed     TFE/MEDQ  D:  12/06/2005  T:  12/06/2005  Job:  [id number]   cc:   Luanna Cole. Lenord Fellers, M.D.  Peter M. [place], M.D.

## 2010-08-10 NOTE — Consult Note (Signed)
NAMEIVANKA, Abigail Sanders NO.:  [id number]   MEDICAL RECORD NO.:  [id number]          PATIENT TYPE:  INP   LOCATION:  4743                         FACILITY:  [facility]   PHYSICIAN:  Lyn Records, M.D.   DATE OF BIRTH:  April 23, 1922   DATE OF CONSULTATION:  DATE OF DISCHARGE:                                   CONSULTATION   REASON FOR CONSULTATION:  Elevated troponin.   CONCLUSION:  1. Elevated troponin of uncertain significance.      a.     No chest pain.      b.     Normal coronary arteries at the time of valve replacement in       1994.  2. History of rheumatic heart disease and mitral stenosis.      a.     Status post mitral valve replacement with St. Jude mechanical       valve in 1994.      b.     Coumadin therapy.  3. Shortness of breath prompting this admission.  Normal BNP on admission.  4. Chronic atrial fibrillation, Coumadin therapy.  5. Dementia.   RECOMMENDATIONS:  1. Repeat 2D Doppler echocardiogram to rule out mitral valve dysfunction.  2. Serial cardiac markers.  3. Serial EKG's.  4. Check digoxin level.  5. Conservative management unless obvious evidence of ischemia.  I am not      even certain that a Cardiolite study is indicated, but I will reserve      final opinion depending on additional marker data.   COMMENTS:  The patient is an 77 year old and has a St. Jude's mechanical  valve in the mitral position placed in 1994 for severe mitral stenosis.  She  had normal coronary arteries at that time and normal LV function.  She has  not had any subsequent development of clinical coronary artery disease.  Her  last echocardiogram was performed in 2003 at which time she had normal LV  function.  Large left atrial enlargement and normal mitral valve prosthetic  function.   She was admitted to the hospital this time because of increasing shortness  of breath on exertion.  She denies chest pain (the patient is severely  impaired by dementia and the  history is obtained from her).  She states that  she has not been troubled by chest pain.  She denies tachy palpitations and  has not had syncope.  The troponin of question was 0.14.  She is currently  pain free.   SIGNIFICANT MEDICAL PROBLEMS:  See above.   ALLERGIES:  None known.   SOCIAL HISTORY:  The patient states that she lives in an apartment here in  [place] with her husband and states that they have been living there for  about a month because they wanted to have a change of scenery from their  home in [place].  The chart states that she has been in assisted  living.  She does not smoke or drink.   FAMILY HISTORY:  Noncontributory at this time.   PHYSICAL EXAMINATION:  GENERAL APPEARANCE:  The patient is somewhat  pleasantly agitated.  She has tried to get up and put her clothes on several  times this morning.  She is not belligerent.  She is in no distress.  VITAL SIGNS:  Blood pressure 140/70, heart rate 70 and irregularly  irregular.  NECK:  Neck veins are not distended with patient lying at 30 degrees.  LUNGS:  She is not short of breath.  Respirations are 16.  Clear to  auscultation and percussion.  CARDIAC:  Mechanical valve closure, S2 compatible with a mitral mechanical  prosthesis.  No murmur is heard.  ABDOMEN:  Soft.  No ascites.  EXTREMITIES:  No edema.  NEUROLOGICAL:  Not oriented to place or time.  No focal deficits are noted.   LABORATORY DATA:  BUN and creatinine are 18 and 1.2, potassium 3.7.  Troponin I is 0.14.  BNP is 148.  TSH was pending.  Digoxin level was low at  less than 0.2.  Hemoglobin 14.4, white blood cell count 10,000, platelets  195,000, INR 1.2 on admission, although, she supposedly is on Coumadin.  EKG  reveals atrial fibrillation with controlled ventricular response, prominent  voltage, nonspecific ST abnormalities, possibly digoxin effect.   DISCUSSION:  The patient has a mildly elevated troponin.  CK-MB's are  normal.   Etiology of this is uncertain.  The patient does have a mitral  mechanical prosthesis and is not adequately anticoagulated.  She needs to  start on IV heparin until INR is at a reasonable range.  We will try  troponins and CK-MB's.  Whether or not an ischemic evaluation will become  necessary will be depending on data.  She should have a repeat echo to rule  out mitral valve thrombosis/dysfunction given the subtherapeutic INR.      Lyn Records, M.D.  Electronically Signed     HWS/MEDQ  D:  11/30/2005  T:  11/30/2005  Job:  [id number]   cc:   Peter M. [place], M.D.  Luanna Cole. Lenord Fellers, M.D.

## 2010-08-10 NOTE — Consult Note (Signed)
NAME:  Abigail Sanders, Abigail Sanders                           ACCOUNT NO.:  [id number]   MEDICAL RECORD NO.:  [id number]                   PATIENT TYPE:  INP   LOCATION:  3740                                 FACILITY:  [facility]   PHYSICIAN:  Genene Churn. Love, M.D.                 DATE OF BIRTH:  1923-02-13   DATE OF CONSULTATION:  10/20/2001  DATE OF DISCHARGE:                              NEUROLOGY CONSULTATION   CHIEF COMPLAINT:  This 77 year old left-handed white married female was seen  in the emergency room for evaluation of transient left facial and hand and  arm weakness.   HISTORY OF PRESENT ILLNESS:  The patient lives with her husband, who has  dementia.  The patient herself has had a dementia and has been evaluated by  Dr. Noreene Filbert at [facility]; with CT scan of the brain  showing mild central and cortical atrophy, normal VDRL, TSH, B12 and folate  levels.  Over the last six weeks she has been on Aricept, approximately 5 mg  one q.d. an hour after the evening meal.  She was in her usual state of  health until this day, when there are mixed descriptions of what happened.  She apparently ate breakfast, went out to the yard to pick up the newspaper,  and there she complained of shortness of breath (according to a neighbor who  was mowing the lawn).  She was taken back inside to the couch.  According to  her husband she had left facial weakness, which apparently resolved.  When  seen in the emergency room by the nurse and the P.A., she had left facial  and left hand and arm weakness.  The patient has no recollection of events.  No seizure was witnessed.  There is no definite history of chest pain or  recent palpitations, though the patient admits that she had palpitations one  week ago.  She has a known past history of atrial fibrillation, mitral valve  prolapse and mitral valve replacement.  She has been on Coumadin.  She has  diet-controlled diabetes mellitus, has had  progressive memory loss, and has  had a benign mediastinal lymphadenopathy.  She has had mitral valve  replacement, for reasons not clear to me.   MEDICATIONS:  1. Coumadin 2.5 mg q.d., except none on Wednesday and Saturdays.  2. Enalapril & Hydrochlorothiazide 240 mg/unknown dose one q.d.  3. Digitek 125 mcg one q.d.  4. Diltiazem-Cardia 240 mg q.d.  5. Kay Ciel 20 mEq q.d.  6. Allegra 60 mg q.d.  7. Actonel 5 mg q.d.  8. Aricept 5 mg q.d.   SOCIAL HISTORY:  She went through the 11th grade in school.  Had one year of  business school.  She has a son 50 and a daughter 31, living and well.  She  does not smoke cigarettes, drink alcohol, has no allergies and is on no  medications.   FAMILY HISTORY:  Her mother died at age 25 of a myocardial infarction.  Father died at age 52 from cancer of the lung.  She has a brother who died  in his 76s from ALS.   PHYSICAL EXAMINATION:  GENERAL:  Revealed a well developed white female, who  had no recollection of events.  She remembers getting up and going outside.  She remembers eating breakfast.  She remembers cleaning off the table, but  does not remember what happened after that.  She has some recollection of  coming to the emergency room by ambulance.  She did complain of headache  when directly asked.   VITAL SIGNS:  Blood pressure (right arm) 162/80, (left arm) 150/80; heart  rate 64 and irregularly irregular (no bruits).  There was a mitral valve  murmur.   MENTAL STATUS:  She was alert and oriented to year, month.  She new the  President but not the [employer].  Did know the governor.  Knew the  number of nickels in a $1.25 but not in $1.35.  Scored 23 out of 30 on MMSE.   HEENT:  Cranial nerve examination revealed visual fields full.  Could see  the left disc, which was flat.  The right disc could not be seen well.  She  had bilateral cataracts.  The tongue was midline.  The uvula was midline.  Gags were present.  Both tympanic  membranes were seen and clear.  The tongue  was midline.   LYMPHATICS:  Sternocleidomastoid and trapezius testing were normal.   NEUROLOGIC:  Motor Strength -- Revealed 5:5 strength in the upper and lower  extremities.  Coordination testing was normal.  Sensory Examination was  intact to pinprick, __________ and vibration.  The deep tendon reflexes were  2+.  Plantar responses were downgoing.  She had an outstretched hand and arm  tremor.   LUNGS:  Clear to auscultation.   HEART:  Revealed a mitral murmur.   ABDOMEN:  There was no enlargement of the liver, spleen or kidneys.   BREASTS:  Normal.   EXTREMITIES:  No clubbing, cyanosis or edema in any extremities.   LABORATORY DATA:  CAT scan showed mild, diffuse atrophy.  Her EKG showed  atrial fibrillation.  Sodium 135, potassium 2.4, chloride 101, CO2 26, BUN  14, creatinine 1.1, glucose 118.  Hemoglobin 15, hematocrit 45.   IMPRESSION:  1. Right brain transischemic attack.  (CODE .9)  2. Shortness of breath, possibly representing congestive heart failure.     (CODE 416.9).  3. Atrial fibrillation (CODE 427.31).  4. Dementia (CODE 294.9).  5. Status post mitral valve replacement (CODE 424.0).  6. History of enlarged mediastinal lymph nodes, of benign nature.   PLAN:  Admit the patient.  Place her on Heparin.  Go to Coumadin.  Consider  an EEG.                                                Genene Churn. Sandria Manly, M.D.    JML/MEDQ  D:  10/20/2001  T:  10/22/2001  Job:  [id number]   cc:   Luanna Cole. Lenord Fellers, M.D.

## 2010-08-10 NOTE — Op Note (Signed)
[place]  Patient:    [name], Abigail Sanders                        MRN: [id number] Proc. Date: 09/29/00 Adm. Date:  [id number] Attending:  [place], Peter Manning CC:         Peter M. [place], M.D.   Operative Report  PREOPERATIVE DIAGNOSIS:  Mediastinal adenopathy.  POSTOPERATIVE DIAGNOSIS:  Mediastinal adenopathy, probably benign by frozen section.  OPERATION:  Bronchoscopy and mediastinoscopy.  SURGEON:  Gwenith Daily. Tyrone Sage, M.D.  BRIEF HISTORY:  The patient is a 77 year old female who had previously undergone mitral valve replacement for mitral stenosis.  She is in chronic atrial fibrillation.  She is admitted to the hospital with chest discomfort and cellulitis with tenderness and swelling in her left leg.  She was initially treated for the cellulitis.  A spiral CT scan was done on the chest because of the chest discomfort and concern for pulmonary embolus.  There was no evidence of pulmonary embolus present; however, the patient did have evidence of mediastinal adenopathy in the subcarinal and right peritracheal area.  This was fairly significant amount of lymph node tissue but without specific diagnosis.  The question of esophageal cancer with metastasis was raised by the radiologist.  Endoscopic examination of the esophagus revealed a distal stricture but benign appearing, and biopsy did not reveal esophageal cancer.  Because of the concern for possibly lymphoma versus inflammatory process versus carcinoma, mediastinoscopy was recommended as a way to obtain a portion of the lymph node tissue.  The patient agreed and signed informed consent.  DESCRIPTION OF PROCEDURE:  The patient underwent general endotracheal anesthesia without incident.  Through endotracheal tube, a fiberoptic bronchoscope was passed down both the right and left main stem bronchus into the subsegmental level without evidence of endobronchial lesion.  The scope was removed.   The patients skin was prepped with Betadine and draped in the usual sterile manner.  A transverse incision was made in the suprasternal notch.  This dissection was carried down to the pretracheal fascia.  The pretracheal fascia was dissected bluntly into the mediastinum.  A mediastinoscope was introduced.  Along the right peritracheal area was a significant size lymph node that biopsied, mostly anthracotic.  Portions of this lymph node were obtained and sent to the pathologist.  In addition, in the carinal area was additional lymph node tissue.  This was also biopsied. Frozen section on both of these specimens revealed only inflammation without any definite diagnosis of lymphoma or malignancy.  The remaining tissue samples will undergo permanent examination.  With operative field hemostatic, scope was removed.  The incision was closed with interrupted 3-0 Vicryl and 4-0 subcuticular stitch in the skin edges.  Dry dressings were applied.  The patient was awakened and extubated in the operating room and transferred to the recovery room.  Blood loss was minimal.  She tolerated the procedure without obvious complications.  Sponge and needle counts reported as correct. DD:  09/29/00 TD:  09/30/00 Job: [id number]

## 2010-08-10 NOTE — H&P (Signed)
[place]. [facility]  Patient:    Abigail Sanders, Abigail Sanders                        MRN: [id number] Adm. Date:  [id number] Attending:  Ilene Qua                         History and Physical  CHIEF COMPLAINT: Abigail Sanders is a 77 year old white female, with a history of rheumatic heart disease, status post mitral valve replacement with a #27 St. Jude mitral valve in October 1994 for severe mitral stenosis.  She has a history of chronic atrial fibrillation and hypertension, and has been on chronic Coumadin therapy.  By cardiac catheterization in 1994 she had normal coronaries.  The patient presents tonight to the emergency room with refractory chest pain.  HISTORY OF PRESENT ILLNESS: The pain has been present for the past two days. It is midsternal in location without radiation.  She has noticed some increased shortness of breath.  She has had no recent fever, cough, nausea or vomiting.  She reports that her pain is not positional and there is no change with movement or deep breathing.  She was seen in the office yesterday and started on Celebrex and Vicodin for costochondritis.  The Vicodin reportedly relieves her pain but her pain recurs 3-1/2 to four hours after taking her Vicodin.  She has also developed a one day history of swelling, redness, and pain in her left leg.  PAST MEDICAL HISTORY:  1. Rheumatic heart disease, with history of mitral stenosis, status post     mitral valve replacement in 1994.  Her last echocardiogram in December     1998 showed good valve function, moderate tricuspid insufficiency,     marked left atrial enlargement, moderate right atrial enlargement,     and normal left ventricular function.  2. Hypertension.  3. Chronic atrial fibrillation.  4. Osteoporosis.  5. Kyphosis.  6. History of epistaxis in 1997.  7. History of skin cancer removal.  ALLERGIES: No known allergies.  CURRENT MEDICATIONS:  1. Coumadin 2.5 mg five days a  week, none on Sunday and Wednesday.  2. Cardizem CD 240 mg q.d.  3. Lanoxin 0.125 mg q.d.  4. Vaseretic 10/25 mg q.d.  5. Potassium 20 mEq q.d.  SOCIAL HISTORY: The patient is retired.  She lives with her husband, who is apparently chronically ill and depends on her for care.  There is no history of alcohol or tobacco use.  She has two children.  FAMILY HISTORY: Father died of prostate cancer.  Mother died at age 53 with heart disease and hypertension.  One brother died with ALS.  REVIEW OF SYSTEMS: As noted in HPI.  She denies any bowel or bladder complaints.  No arthralgias.  PHYSICAL EXAMINATION:  GENERAL: The patient is an elderly white female in no apparent distress.  VITAL SIGNS: Blood pressure 144/74, pulse 111 and irregular, respirations 22, temperature 97.5 degrees.  HEENT: Unremarkable.  PERRL.  Conjunctivae clear.  Oropharynx clear.  NECK: Supple without JVD or adenopathy or bruits.  No thyromegaly.  CHEST: Lungs clear.  Her chest wall does demonstrate tenderness to palpation at the lower sternum and recreates her pain there.  CARDIAC: Irregular rate and rhythm, with good mechanical mitral valve click. She has a soft grade 2/6 systolic murmur at the left sternal border.  There are no gallops.  ABDOMEN: Soft, nontender.  Bowel  sounds are positive.  No hepatosplenomegaly.  EXTREMITIES: Femoral and pedal pulses are 2+ and symmetric.  The patient does have redness and swelling of her left lower extremity and calf tenderness to palpation.  NEUROLOGIC: Intact.  LABORATORY DATA: Chest x-ray cardiomegaly with chronic changes, no active disease.  ECG shows atrial fibrillation with diffuse nonspecific ST-T wave changes.  Sodium 134, potassium 3.3, chloride 94, CO2 29, BUN 29, creatinine 1.3, glucose 126.  Albumin 2.7, total bilirubin 1.8.  WBC 11,800 with neutrophilia, hemoglobin 13.9, hematocrit 40.3; platelets 151,000.  CK 14, MB 0.5, troponin 0.05.  Digoxin level  0.4.  PT 21.7, INR 2.5, PTT 69.  IMPRESSION:  1. Chest pain, most consistent with costochondritis.  Rule out pulmonary     embolus.  2. Left lower extremity phlebitis.  Need to rule out deep vein thrombosis.  3. Status post mechanical mitral valve replacement for severe mitral     stenosis.  4. Chronic atrial fibrillation.  5. Hypokalemia.  6. Anticoagulation.  7. Hypertension.  PLAN: The patient will be admitted to telemetry and we will check repeat cardiac enzymes in eight hours.  Will also obtain sedimentation rate and D-dimer study.  Will obtain a CT of the chest with contrast to rule out pulmonary embolus.  Depending on these results may need to consider lower extremity venous Dopplers.  Will keep her left leg elevated, with warm compresses, and will start her on amoxicillin p.o. for phlebitis.  Will continue Vicodin and Celebrex for her sternal pain. DD:  09/19/00 TD:  09/19/00 Job: [id number]

## 2010-08-10 NOTE — Discharge Summary (Signed)
NAME:  Abigail Sanders, Abigail Sanders                           ACCOUNT NO.:  [id number]   MEDICAL RECORD NO.:  [id number]                   PATIENT TYPE:  INP   LOCATION:  3740                                 FACILITY:  [facility]   PHYSICIAN:  Luanna Cole. Lenord Fellers, M.D.                DATE OF BIRTH:  04-Jan-1923   DATE OF ADMISSION:  10/20/2001  DATE OF DISCHARGE:  10/26/2001                                 DISCHARGE SUMMARY   DISCHARGE DIAGNOSES:  1. Transient ischemic attack.  2. Alzheimer's dementia.  3. Hypertension.  4. Status post mitral valve replacement for mitral stenosis requiring     chronic Coumadin therapy.  5. Osteoporosis.   BRIEF HISTORY:  This 77 year old white female with history of mitral valve  replacement in 1994, maintained on Coumadin per Dr. Peter Swaziland, presented  to the emergency department via Emergency Medical Services vehicle.  She had  complained of shortness of breath around 11:30 A.M. on the day of admission.  She apparently complained to some family members of fatigue for several days  prior to admission.  The patient has a history of Alzheimer's type dementia  and is on Aricept per Dr. Shireen Quan.  Emergency medical services report  indicates the patient complained of shortness of breath and dizziness.  Her  husband was at home and a neighbor came in to check on her.  Her husband has  dementia also, so his history is not reliable.  At the time of my evaluation  the neighbor was not present to give a history.  It is not exactly clear  what had had happened to the patient, but possibly a TIA.  The family had  been allowing the patient to take her own medications.  The patient and her  husband had been unwilling to go to an assisted living facility.  Her INR on  Coumadin was last checked by Dr. Swaziland in mid June at which time it was  therapeutic.  However, she missed her appointment for Coumadin recheck on  July 24th.  My records indicate she was to take Coumadin 2.5 mg  daily and on  admission her INR in the emergency department is 1.6.  Her troponin I  fraction is up a bit at 0.08.  She has a history of chronic atrial  fibrillation and hypertension.  She is on Digitek 0.125 mg daily, except for  Sunday, Cartia XT 240 mg daily, K-Dur 20 mEq daily, Vasotec 10 mg daily,  Paxil CR 12.5 mg daily, Aricept 10 mg daily, and Actonel for osteoporosis in  her lumbar spine.   We were advised by a physician's assistant in the emergency department the  patient had clearly had a TIA with resolution of symptoms involving slurred  speech and extremity weakness.  CT of the brain without contrast showed  atrophy, but no hemorrhage.  The patient is subsequently admitted for  observation and  IV heparin along with adjustment of her Coumadin dosage.   The patient had an MRI of her brain in May 2003 showing atrophy.  She has  been diagnosed with Alzheimer's type dementia and has previously had B12,  folate levels and thyroid functions, all of which were normal as an  outpatient.   HOSPITAL COURSE:  The patient was admitted to a telemetry floor.  She was  placed on IV heparin.  Dr. Peter Swaziland, her cardiologist, was notified of  the admission and the patient was seen in consultation by Dr. Avie Echevaria  shortly after admission on October 20, 2001.  It was his impression the patient  had a right brain TIA with associated shortness of breath.  Her recommended  heparin therapy until the patient's Coumadin could be therapeutic once  again.  The patient was alert and oriented on Dr. Imagene Gurney exam.  She knew the  president, but not the vice president.  She scored 23/30 on MMSA.  Heparin  was administered per pharmacy protocol and was monitored by pharmacy.  Coumadin was also dosed and monitored by pharmacy.   Considerable discussion was held with the patient's family and with the  patient regarding her need to consider an assisted living facility for she  and her husband.  She was  rather adamant about not wanting to go to such a  facility.  She has no understanding that the environment may be unsafe.  She  continues to deny that there is any noncompliance with her medication.  There is a possibility that she and her husband could go to the [facility]  here in [place] because he is a Actor; however, the family has not looked  into this and probably should do so in the near future.  The family was told  that the patient would be discharged home only if they could provide  assistance in terms of someone coming in and checking on her frequently, and  managing her medications.  They agreed to find someone to do that.   The patient underwent a 2-D echocardiogram showing no evidence of a cardiac  source of embolism.  The aortic valve was trileaflet, aortic valve thickness  was normal, and no evidence of aortic stenosis.  Mitral valve showed a  mechanical mitral valve prosthesis (St. Jude).  There was no significant  mitral regurgitation.  Left atrium was moderately-to-markedly dilated.  Right ventricular size was normal.  Right atrium was moderately-to-markedly  dilated.  No pericardial effusion was noted.  The patient also underwent an  MRI of the brain showing no recent CVA, just atrophy basically.  We did go  ahead and pursue a CT of the chest with contrast because the patient had  some complaint of recent shortness of breath.  She was hospitalized  previously with some unexplained mediastinal adenopathy that subsequently  turned out to be benign after a rather extensive workup by Dr. Tyrone Sage and  Dr. Swaziland.  CT of the chest with contrast showed no significant mediastinal  or hilar adenopathy.  There were no lung parenchymal lesions noted.  Admission EKG showed atrial fibrillation, which is her basic rhythm for the  most part; however, during this hospitalization she had some episodes of  bradycardia, rate decreased to 35 on October 24, 2001.  Subsequently  patient had a decrease in her dose of diltiazem to 120 mg daily.  There was some  question of sleep apnea in the hospital and a pnea evaluation has been  planned  as an outpatient for early September.   The patient continued to have some episodes of bradycardia and frequent  small pauses.  Her Digitek was discontinued.  This seemed to help quite a  bit.  The patient was anxious to go home, but it took some time for her INR  to become therapeutic once again.  Finally, by October 26, 2001 the patient's  bradycardia and pauses had resolved.  Dr. Swaziland felt she was stable to go  home from a cardiac standpoint as did Dr. Sandria Manly from a neurologic standpoint.  Her INR on October 26, 2001 was 3.6.  Thus she was discharged home in stable  condition on the following medications.   DISCHARGE MEDICATIONS:  1. Coumadin 2.5 mg daily.  2. Paxil CR 12.5 mg h.s.  3. Aricept 10 mg daily.  4. K-Dur 20 mEq daily.  5. Vasotec 10 mg daily.  6. Actonel 35 mg daily.  7. In other words diltiazem and Lanoxin (Digitek) were stopped entirely.    FOLLOW UP:  The patient was to see me two weeks after discharge and to have  a pro time drawn at Dr. Elvis Coil office Thursday, August 07th, and see Dr.  Swaziland three weeks after discharge.                                                 Luanna Cole. Lenord Fellers, M.D.    MJB/MEDQ  D:  12/08/2001  T:  12/09/2001  Job:  [id number]   cc:   Peter M. Swaziland, M.D.  [address]  [place], [place] [address]  Fax: [phone]   Genene Churn. Love, M.D.  [address]  [place]  [place] [address]  Fax: [phone]

## 2010-08-10 NOTE — H&P (Signed)
NAME:  Abigail Sanders, Abigail Sanders                           ACCOUNT NO.:  [id number]   MEDICAL RECORD NO.:  [id number]                   PATIENT TYPE:  INP   LOCATION:  3740                                 FACILITY:  [facility]   PHYSICIAN:  Luanna Cole. Lenord Fellers, M.D.                DATE OF BIRTH:  1922/12/31   DATE OF ADMISSION:  10/20/2001  DATE OF DISCHARGE:                                HISTORY & PHYSICAL   HISTORY OF PRESENT ILLNESS:  This 77 year old white female with history of  mitral valve replacement in 1984 by Dr. Tyrone Sage maintained on chronic  Coumadin therapy by Dr. Swaziland presented to the emergency department today  around 12:30 p.m. by EMS transport.  The patient had complained of shortness  of breath around 11:30 a.m. according to her family.  Apparently, a neighbor  came in to check her.  Her family says that she has complained of fatigue  for the past few days, but nothing specific.  The patient has a history of  Alzheimer's type dementia and is on Aricept per Dr. Katrinka Blazing.  The EMS reported  patient complained of shortness of breath and dizziness.  The patient's  husband was at home at the time.  He has dementia himself so his history is  somewhat unreliable.  The neighbor that checked on the patient today is not  present at this time to give a history.  The family does not know exactly  what happened.  The patient's daughter was out of town and her son was not  present.  The family has been allowing the patient to take her own  medications.  The patient denies noncompliance with medication, but clearly  has memory problems.  Abigail Sanders and her husband have been unwilling to go  to an assisted living facility.  The patient's INR was last checked by Dr.  Swaziland June 16 at which time it was 2.47.  She apparently missed a recent  appointment to have her pro time checked.  My records indicate she takes  Coumadin 2.5 mg daily.  Her troponin I is elevated in the emergency  department at 0.08 but  her CK-MB fraction is not increased.  She has a  history of chronic atrial fibrillation and had mitral valve replacement done  for mitral stenosis secondary to rheumatic heart disease (St. Jude valve).  She has a history of hypertension.  She is on Digitek 0.125 mg daily except  Sunday, Cartia XT 240 mg daily, K-Dur 20 mEq daily, Vasotec 10 mg daily,  Paxil CR 12.5 mg daily, Aricept 10 mg daily, Actonel for osteoporosis of the  lumbar spine.  The physician's assistant in the emergency department called  me to say that the patient had apparent TIA with resolution of symptoms.  He  indicated patient had had slurred speech and extremity weakness.  CT of the  brain without contrast showed atrophy (diffuse)  and no hemorrhage.  The  patient is now admitted for observation, IV heparin, and adjustment of  Coumadin dose.  The patient had an MRI of the brain May 2003 showing diffuse  atrophy, but no prior CVA.  The patient had a complete dementia work-up and  has seen Dr. Noreene Filbert a couple of times in the recent past.  B12 and folate  levels as well as thyroid functions were normal.   ALLERGIES:  No known drug allergies.   PAST MEDICAL HISTORY:  She does take SBE prophylaxis for dental procedures.  History of mild glucose intolerance, asymptomatic gallstones, osteoporosis  of the lumbar spine, hypertension, chronic atrial fibrillation.  There is a  history of uterine prolapse and she did wear a pessary.  Dr. Rosalio Macadamia is  her GYN physician.  Had appendectomy in 1968, mammogram 2002, tetanus  immunization 1995, Pneumovax immunization November 2002.  She was last  admitted June 2002 with chest pain and was found to have, after extensive  work-up, no evidence of cancer and a benign mediastinal adenopathy on  mediastinoscopy.  She also has a small stricture on endoscopy and a hiatal  hernia.  Her last CT of the chest was November 2002 showing resolution of  the mediastinal adenopathy.   SOCIAL  HISTORY:  She is married and has two adult children, a son and  daughter.  Does not smoke or consume alcohol.  Has an 11th grade education.  Her husband has dementia and they currently live unassisted independently.  We have discussed previously their living arrangements and their ability to  take their own medication but daughter indicates that patient and her  husband had been unwilling to consider other alternatives.   FAMILY HISTORY:  Father died at age 10 of prostate cancer.  Mother died at  age 74 of coronary artery disease and congestive heart failure.  The patient  had one brother who died with ALS.  The patient had no sisters.   PHYSICAL EXAMINATION:  VITAL SIGNS:  Temperature in the emergency department  97.9 degrees orally, blood pressure 129/70, pulse 86, respiratory rate 18,  O2 saturation 98% on 2 L of oxygen.  SKIN:  Pale, warm, and dry.  NODES:  None.  HEENT:  Head is atraumatic, normocephalic.  Sclerae and conjunctivae are  clear.  TMs and pharynx are clear.  NECK:  Supple without JVD, thyromegaly.  No bruits.  CHEST:  Clear.  CARDIAC:  A regular valve click is appreciated.  She has a fairly regular  rhythm today.  No gallops appreciated.  ABDOMEN:  No hepatosplenomegaly, masses, or tenderness.  EXTREMITIES:  No pitting edema.  NEUROLOGIC:  She does recognize me and calls me by name.  She is alert.  She  does not recall the episode at home whatsoever, apparently.  There is no  facial weakness at this point in time.  Her speech is completely coherent  and not slurred at all.  She moves all four extremities and has normal  muscle strength.   IMPRESSION:  1. Transient ischemic attack.  2. Fatigue and dyspnea, rule out myocardial infarction, rule out congestive     heart failure.  Chest x-ray in the emergency department showed no     evidence of heart failure.  3. Alzheimer's dementia.  4. Hypertension.  5. Osteoporosis. 6. History of benign mediastinal  adenopathy.   PLAN:  The patient is to be seen by neurology for consultation.  The patient  will be started on IV heparin by  pharmacy protocol while her Coumadin is  adjusted.  Pro times and PTT will be ordered daily.  Dr. Swaziland has been  notified of patient's admission and has seen briefly.  MI will be ruled out.  She will be maintained on telemetry.  The patient will have arterial blood  gas on room air.  She will be observed for further episodes suggesting  TIA/CVA.                                               Luanna Cole. Lenord Fellers, M.D.    MJB/MEDQ  D:  10/20/2001  T:  10/22/2001  Job:  [id number]   cc:   Peter M. Swaziland, M.D.   Genene Churn. Love, M.D.   Gwenith Daily Tyrone Sage, M.D.

## 2010-08-10 NOTE — H&P (Signed)
NAME:  Abigail Sanders, Abigail Sanders                 ACCOUNT NO.:  [id number]   MEDICAL RECORD NO.:  [id number]          PATIENT TYPE:  INP   LOCATION:  2550                         FACILITY:  [facility]   PHYSICIAN:  Larina Earthly, M.D.    DATE OF BIRTH:  [birth year]   DATE OF ADMISSION:  12/05/2005  DATE OF DISCHARGE:                                HISTORY & PHYSICAL   ADMISSION DIAGNOSIS:  Acute left arm ischemia with probable left brachial  embolus.   HISTORY OF PRESENT ILLNESS:  The patient is an 77 year old white female with  history of prior mitral valve replacement, also in chronic atrial  fibrillation.  she had just been discharged from [facility] on the  day prior to this presentation after an admission from November 29, 2005 to  December 04, 2005.  She had been admitted with congestive heart failure.  The daughter reports that she had had some coolness in her arm the day prior  to this, but presented to the emergency room at approximately 11 p.m. with  profound ischemia of her left arm.  She had no brachial, radial or ulnar  pulse on the left.   PAST MEDICAL HISTORY:  Significant for mitral valve repair several years  ago.  She is on chronic atrial fibrillation.  She also has dementia.  She  lives in a nursing facility.  She was subtherapeutic on her Coumadin when  admitted to the hospital on November 29, 2005 and had been therapeutic when  she was discharged.   ALLERGIES:  She has no known allergies.   SOCIAL HISTORY:  She lives at [facility].   MEDICATIONS ON ADMISSION:  1. Aricept 10 mg daily.  2. Namenda 10 mg daily.  3. Zyprexa 5 mg daily.  4. Vasotec 10 mg daily.  5. Paxil 10 mg daily.  6. Potassium chloride 20 mEq daily.  7. Protonix 40 mg daily.  8. Imdur 30 mg daily.  9. Aspirin 325 mg daily.  10.Lopressor 50 mg twice daily.  11.Coumadin 5 mg daily.  12.Claritin 10 mg daily.   PHYSICAL EXAM:  GENERAL:  The patient was sedated from pain  medications.  Her daughter is present and reports that she was in her usual mental status  prior to sedation with pain medications in the emergency department.  She  was able to raise her left hand to some degree and did not have any  sensation in her left hand.  VITAL SIGNS:  Heart was irregularly irregular and rate at approximately 120.  Blood pressure was 120/55.  EXTREMITIES:  Her left hand was cold with no sensory function and diminished  motor function.  Her right arm had normal neurologic status and had 2+  radial and ulnar arteries.  She had 2+ femoral, 2+ popliteal and 2+  posterior tibial pulses bilaterally.   IMPRESSION:  Left brachial embolus.   PLAN:  The patient will be admitted and taken emergently to the operating  room for embolectomy.      Larina Earthly, M.D.  Electronically Signed     TFE/MEDQ  D:  12/06/2005  T:  12/06/2005  Job:  [id number]   cc:   Luanna Cole. Lenord Fellers, M.D.  Peter M. Swaziland, M.D.

## 2011-01-07 LAB — I-STAT 8, (EC8 V) (CONVERTED LAB)
Acid-base deficit: 13 — ABNORMAL HIGH
Bicarbonate: 13.8 — ABNORMAL LOW
Glucose, Bld: 136 — ABNORMAL HIGH
TCO2: 15
pCO2, Ven: 34.4 — ABNORMAL LOW
pH, Ven: 7.213 — ABNORMAL LOW

## 2011-01-07 LAB — POCT CARDIAC MARKERS
CKMB, poc: 1.5
Myoglobin, poc: 347
Operator id: 234501
Troponin i, poc: 0.06 — ABNORMAL HIGH

## 2011-01-07 LAB — POCT I-STAT CREATININE
Creatinine, Ser: 2.1 — ABNORMAL HIGH
Operator id: 234501

## 2012-06-23 DEATH — deceased

## 2014-06-16 ENCOUNTER — Telehealth: Payer: Self-pay

## 2014-06-16 NOTE — Telephone Encounter (Signed)
Patient is requesting an inhaler   [phone] (H)

## 2014-06-16 NOTE — Telephone Encounter (Signed)
Pt has never been seen here. Left a detailed message letting pt know we cannot Rx this for her without an office visit.
